# Patient Record
Sex: Male | Born: 1980 | Race: White | Hispanic: No | Marital: Married | Smoking: Never smoker
Health system: Southern US, Community
[De-identification: ages and names within clinical notes are randomized; demographics above are authoritative.]

## PROBLEM LIST (undated history)

## (undated) DIAGNOSIS — T7840XA Allergy, unspecified, initial encounter: Secondary | ICD-10-CM

## (undated) HISTORY — DX: Allergy, unspecified, initial encounter: T78.40XA

---

## 2018-02-01 ENCOUNTER — Other Ambulatory Visit: Payer: Self-pay | Admitting: Chiropractic Medicine

## 2018-02-01 ENCOUNTER — Ambulatory Visit
Admission: RE | Admit: 2018-02-01 | Discharge: 2018-02-01 | Disposition: A | Discharge status: Home / Self Care | Payer: BC Managed Care – PPO | Source: Ambulatory Visit | Attending: Chiropractic Medicine | Admitting: Chiropractic Medicine

## 2018-02-01 DIAGNOSIS — M5136 Other intervertebral disc degeneration, lumbar region: Secondary | ICD-10-CM | POA: Insufficient documentation

## 2018-02-01 DIAGNOSIS — M461 Sacroiliitis, not elsewhere classified: Secondary | ICD-10-CM | POA: Diagnosis present

## 2018-02-01 DIAGNOSIS — M545 Low back pain: Secondary | ICD-10-CM | POA: Diagnosis present

## 2018-02-01 DIAGNOSIS — M48061 Spinal stenosis, lumbar region without neurogenic claudication: Secondary | ICD-10-CM | POA: Insufficient documentation

## 2018-02-01 DIAGNOSIS — M25551 Pain in right hip: Secondary | ICD-10-CM

## 2018-02-28 ENCOUNTER — Ambulatory Visit: Payer: BC Managed Care – PPO | Admitting: Family Medicine

## 2018-02-28 ENCOUNTER — Encounter: Payer: Self-pay | Admitting: Family Medicine

## 2018-02-28 VITALS — BP 112/68 | HR 68 | Temp 97.7°F | Resp 16 | Ht 71.0 in | Wt 296.0 lb

## 2018-02-28 DIAGNOSIS — M7061 Trochanteric bursitis, right hip: Secondary | ICD-10-CM

## 2018-02-28 DIAGNOSIS — J069 Acute upper respiratory infection, unspecified: Secondary | ICD-10-CM

## 2018-02-28 DIAGNOSIS — J301 Allergic rhinitis due to pollen: Secondary | ICD-10-CM

## 2018-02-28 MED ORDER — NAPROXEN 500 MG PO TBEC: 500.0000 mg | DELAYED_RELEASE_TABLET | Freq: Two times a day (BID) | ORAL | 3 refills | Status: DC

## 2018-02-28 MED ORDER — LORATADINE 10 MG PO TABS
10.0000 mg | ORAL_TABLET | Freq: Every day | ORAL | 3 refills | Status: DC
Start: 2018-02-28 — End: 2018-12-12

## 2018-02-28 NOTE — Progress Notes (Signed)
Patient: Marvin James Male    DOB: Jan 25, 1981   36 y.o.   MRN: [id number] Visit Date: 02/28/2018  Today's Provider: [name], MD   Chief Complaint  Patient presents with  . New Patient (Initial Visit)  . Sinus Problem  . Hip Pain   Subjective:    HPI Pt is here today for possible sinus infection. He has sinus congestion, sinus pain, pressure, cough in the mornings, and sneezing. He reports that he has been going on for about 2 weeks. He also has been having problems with his hip and right leg. This has been going on for months. IT is worse when picking up heavy objects, going up steps and walking a lot. He has been to  The chiropractor did x-rays and showed mild degenerative disc space narrowing at L3-L4. No other bony abnormality.     Not on File  No current outpatient medications on file.  Review of Systems  Constitutional: Positive for fatigue.  HENT: Positive for congestion, rhinorrhea, sinus pressure, sneezing and sore throat.   Eyes: Positive for photophobia, redness and itching.  Respiratory: Positive for cough.   Cardiovascular: Negative.   Gastrointestinal: Negative.   Endocrine: Negative.   Genitourinary: Negative.   Musculoskeletal: Positive for back pain.  Skin: Negative.   Allergic/Immunologic: Positive for environmental allergies.  Neurological: Negative.   Hematological: Negative.   Psychiatric/Behavioral: Negative.     Social History   Tobacco Use  . Smoking status: Never Smoker  . Smokeless tobacco: Never Used  Substance Use Topics  . Alcohol use: Yes   Objective:   BP 112/68 (BP Location: Left Arm, Patient Position: Sitting, Cuff Size: Large)   Pulse 68   Temp 97.7 F (36.5 C) (Oral)   Resp 16   Ht  (1.803 m)   Wt 296 lb (134.3 kg)   BMI 41.28 kg/m  Vitals:   02/28/18 1440  BP: 112/68  Pulse: 68  Resp: 16  Temp: 97.7 F (36.5 C)  TempSrc: Oral  Weight: 296 lb (134.3 kg)  Height:  (1.803 m)      Physical Exam  Constitutional: He is oriented to person, place, and time. He appears well-developed and well-nourished.  HENT:  Head: Normocephalic and atraumatic.  Right Ear: External ear normal.  Left Ear: External ear normal.  Nose: Nose normal.  Mouth/Throat: Oropharynx is clear and moist.  Eyes: Conjunctivae are normal. No scleral icterus.  Neck: No thyromegaly present.  Cardiovascular: Normal rate, regular rhythm and normal heart sounds.  Pulmonary/Chest: Effort normal and breath sounds normal.  Abdominal: Soft.  Musculoskeletal: He exhibits no edema.  Lymphadenopathy:    He has no cervical adenopathy.  Neurological: He is alert and oriented to person, place, and time.  Skin: Skin is warm and dry.  Psychiatric: He has a normal mood and affect. His behavior is normal. Judgment and thought content normal.        Assessment & Plan:     1. Encounter to establish care CPE later this year.  2. Trochanteric bursitis of right hip  - naproxen (EC NAPROSYN) 500 MG EC tablet; Take 1 tablet (500 mg total) by mouth 2 (two) times daily with a meal.  Dispense: 60 tablet; Refill: 3  3. Seasonal allergic rhinitis due to pollen  - loratadine (CLARITIN) 10 MG tablet; Take 1 tablet (10 mg total) by mouth daily.  Dispense: 90 tablet; Refill: 3  4. Upper respiratory tract infection, unspecified type Viral.  Mucinex and fluids,rest. 5. Possible OSA  I have done the exam and reviewed the chart and it is accurate to the best of my knowledge. Dentist has been used and  any errors in dictation or transcription are unintentional. Julieanne Manson M.D. [facility]       [name], MD  [facility]

## 2018-02-28 NOTE — Patient Instructions (Signed)
Try Mucinex

## 2018-03-24 ENCOUNTER — Telehealth: Payer: Self-pay | Admitting: Emergency Medicine

## 2018-03-24 DIAGNOSIS — M7061 Trochanteric bursitis, right hip: Secondary | ICD-10-CM

## 2018-03-24 MED ORDER — PREDNISONE 10 MG (21) PO TBPK: ORAL_TABLET | ORAL | 0 refills | Status: DC

## 2018-03-24 NOTE — Telephone Encounter (Signed)
Sent. Pt wife advised.

## 2018-03-24 NOTE — Telephone Encounter (Signed)
Can stop Naproxen and try Prednisone 10mg  6 day taper. Can f/u here in 1 week if not improving or refer to Ortho.

## 2018-03-24 NOTE — Telephone Encounter (Signed)
Pt reports that he has been taking the naproxen as prescribed and his hip/back is not any better. Is there anything else that can be done or what's the next step? Please advise. Thanks. Pt uses [facility] [address].

## 2018-05-03 ENCOUNTER — Encounter: Payer: Self-pay | Admitting: Family Medicine

## 2018-05-04 ENCOUNTER — Ambulatory Visit (INDEPENDENT_AMBULATORY_CARE_PROVIDER_SITE_OTHER): Payer: BC Managed Care – PPO | Admitting: Family Medicine

## 2018-05-04 VITALS — BP 110/70 | HR 70 | Temp 98.3°F | Resp 16 | Wt 291.0 lb

## 2018-05-04 DIAGNOSIS — Z0000 Encounter for general adult medical examination without abnormal findings: Secondary | ICD-10-CM | POA: Diagnosis not present

## 2018-05-04 LAB — POCT URINALYSIS DIPSTICK
APPEARANCE: NEGATIVE
Bilirubin, UA: NEGATIVE
Blood, UA: NEGATIVE
GLUCOSE UA: NEGATIVE
Ketones, UA: NEGATIVE
Leukocytes, UA: NEGATIVE
Nitrite, UA: NEGATIVE
Protein, UA: NEGATIVE
Spec Grav, UA: 1.02 (ref 1.010–1.025)
Urobilinogen, UA: 0.2 E.U./dL
pH, UA: 6 (ref 5.0–8.0)

## 2018-05-04 NOTE — Progress Notes (Signed)
Patient: Marvin ProwsDavid Kellman, Male    DOB: 03/19/1981, 37 y.o.   MRN: [id number] Visit Date: 05/04/2018  Today's Provider: Megan Mansichard Gilbert Jr, MD   Chief Complaint  Patient presents with  . Annual Exam   Subjective:  Marvin James is a 37 y.o. male who presents today for health maintenance and complete physical. He feels well. He reports exercising none. He reports he is sleeping well.   Review of Systems  Constitutional: Negative.   HENT: Positive for ear pain.   Eyes: Positive for photophobia and itching.  Respiratory: Negative.        Pt does snore--no known apnea.  Cardiovascular: Negative.   Gastrointestinal: Negative.   Endocrine: Negative.   Genitourinary: Negative.   Musculoskeletal: Positive for back pain.  Skin: Negative.   Allergic/Immunologic: Negative.   Neurological: Negative.   Hematological: Negative.   Psychiatric/Behavioral: Negative.     Social History   Socioeconomic History  . Marital status: Married    Spouse name: Not on file  . Number of children: Not on file  . Years of education: Not on file  . Highest education level: Not on file  Occupational History  . Not on file  Social Needs  . Financial resource strain: Not on file  . Food insecurity:    Worry: Not on file    Inability: Not on file  . Transportation needs:    Medical: Not on file    Non-medical: Not on file  Tobacco Use  . Smoking status: Never Smoker  . Smokeless tobacco: Never Used  Substance and Sexual Activity  . Alcohol use: Yes  . Drug use: Never  . Sexual activity: Yes  Lifestyle  . Physical activity:    Days per week: Not on file    Minutes per session: Not on file  . Stress: Not on file  Relationships  . Social connections:    Talks on phone: Not on file    Gets together: Not on file    Attends religious service: Not on file    Active member of club or organization: Not on file    Attends meetings of clubs or organizations: Not on file    Relationship status: Not on  file  . Intimate partner violence:    Fear of current or ex partner: Not on file    Emotionally abused: Not on file    Physically abused: Not on file    Forced sexual activity: Not on file  Other Topics Concern  . Not on file  Social History Narrative  . Not on file    There are no active problems to display for this patient.   No past surgical history on file.  His family history includes Arthritis in his mother; Diabetes in his father; Hypertension in his father; Stroke in his father.     Outpatient Encounter Medications as of 05/04/2018  Medication Sig  . loratadine (CLARITIN) 10 MG tablet Take 1 tablet (10 mg total) by mouth daily.  . naproxen (EC NAPROSYN) 500 MG EC tablet Take 1 tablet (500 mg total) by mouth 2 (two) times daily with a meal.  . [DISCONTINUED] predniSONE (STERAPRED UNI-PAK 21 TAB) 10 MG (21) TBPK tablet Take 6 pills on the first day then decrease by one daily until finished.   No facility-administered encounter medications on file as of 05/04/2018.     Patient Care Team: Maple HudsonGilbert, Richard L Jr., MD as PCP - General (Family Medicine)      Objective:  Vitals:  Vitals:   05/04/18 1419  BP: 110/70  Pulse: 70  Resp: 16  Temp: 98.3 F (36.8 C)  TempSrc: Oral  Weight: 291 lb (132 kg)    Physical Exam  Constitutional: He is oriented to person, place, and time. He appears well-developed and well-nourished.  HENT:  Head: Normocephalic and atraumatic.  Right Ear: External ear normal.  Left Ear: External ear normal.  Nose: Nose normal.  Mouth/Throat: Oropharynx is clear and moist.  Eyes: Pupils are equal, round, and reactive to light. Conjunctivae and EOM are normal.  Neck: Normal range of motion. Neck supple.  Cardiovascular: Normal rate, regular rhythm, normal heart sounds and intact distal pulses.  Pulmonary/Chest: Effort normal and breath sounds normal.  Abdominal: Soft.  Genitourinary: Rectum normal, prostate normal and penis normal.   Musculoskeletal: Normal range of motion.  Neurological: He is alert and oriented to person, place, and time.  Skin: Skin is warm and dry.  Psychiatric: He has a normal mood and affect. His behavior is normal. Judgment and thought content normal.     Depression Screen PHQ 2/9 Scores 05/04/2018 02/28/2018  PHQ - 2 Score 2 0  PHQ- 9 Score 8 1      Assessment & Plan:     Routine Health Maintenance and Physical Exam  Exercise Activities and Dietary recommendations Goals    None       There is no immunization history on file for this patient.  Health Maintenance  Topic Date Due  . HIV Screening  04/18/1995  . TETANUS/TDAP  04/18/1999  . INFLUENZA VACCINE  05/12/2018     Discussed health benefits of physical activity, and encouraged him to engage in regular exercise appropriate for his age and condition. Possible mild OSA Pt to work on weight loss. Chronic Low Back Pain/strain Work on [employer].  I have done the exam and reviewed the chart and it is accurate to the best of my knowledge. Dentist has been used and  any errors in dictation or transcription are unintentional. Julieanne Manson M.D. [facility]

## 2018-05-04 NOTE — Patient Instructions (Signed)
Patient advised to try Yoga or Pilates for exercise

## 2018-05-10 ENCOUNTER — Telehealth: Payer: Self-pay

## 2018-05-10 LAB — COMPREHENSIVE METABOLIC PANEL
ALK PHOS: 52 IU/L (ref 39–117)
ALT: 30 IU/L (ref 0–44)
AST: 18 IU/L (ref 0–40)
Albumin/Globulin Ratio: 1.3 (ref 1.2–2.2)
Albumin: 4 g/dL (ref 3.5–5.5)
BUN/Creatinine Ratio: 14 (ref 9–20)
BUN: 13 mg/dL (ref 6–20)
Bilirubin Total: 1 mg/dL (ref 0.0–1.2)
CO2: 24 mmol/L (ref 20–29)
CREATININE: 0.96 mg/dL (ref 0.76–1.27)
Calcium: 8.7 mg/dL (ref 8.7–10.2)
Chloride: 102 mmol/L (ref 96–106)
GFR calc Af Amer: 115 mL/min/{1.73_m2} (ref >59)
GFR calc non Af Amer: 100 mL/min/{1.73_m2} (ref >59)
GLUCOSE: 83 mg/dL (ref 65–99)
Globulin, Total: 3 g/dL (ref 1.5–4.5)
Potassium: 3.6 mmol/L (ref 3.5–5.2)
SODIUM: 139 mmol/L (ref 134–144)
Total Protein: 7 g/dL (ref 6.0–8.5)

## 2018-05-10 LAB — CBC WITH DIFFERENTIAL/PLATELET
BASOS ABS: 0 10*3/uL (ref 0.0–0.2)
Basos: 0 %
EOS (ABSOLUTE): 0.2 10*3/uL (ref 0.0–0.4)
Eos: 2 %
Hematocrit: 44 % (ref 37.5–51.0)
Hemoglobin: 15.4 g/dL (ref 13.0–17.7)
Immature Grans (Abs): 0 10*3/uL (ref 0.0–0.1)
Immature Granulocytes: 0 %
LYMPHS ABS: 3.7 10*3/uL — AB (ref 0.7–3.1)
Lymphs: 42 %
MCH: 33.6 pg — ABNORMAL HIGH (ref 26.6–33.0)
MCHC: 35 g/dL (ref 31.5–35.7)
MCV: 96 fL (ref 79–97)
MONOCYTES: 8 %
MONOS ABS: 0.7 10*3/uL (ref 0.1–0.9)
Neutrophils Absolute: 4.2 10*3/uL (ref 1.4–7.0)
Neutrophils: 48 %
Platelets: 243 10*3/uL (ref 150–450)
RBC: 4.59 x10E6/uL (ref 4.14–5.80)
RDW: 13.3 % (ref 12.3–15.4)
WBC: 8.8 10*3/uL (ref 3.4–10.8)

## 2018-05-10 LAB — LIPID PANEL WITH LDL/HDL RATIO
Cholesterol, Total: 152 mg/dL (ref 100–199)
HDL: 51 mg/dL (ref >39)
LDL Calculated: 80 mg/dL (ref 0–99)
LDl/HDL Ratio: 1.6 ratio (ref 0.0–3.6)
Triglycerides: 107 mg/dL (ref 0–149)
VLDL Cholesterol Cal: 21 mg/dL (ref 5–40)

## 2018-05-10 LAB — TSH: TSH: 3.81 u[IU]/mL (ref 0.450–4.500)

## 2018-05-10 NOTE — Telephone Encounter (Signed)
LMTCB   ----- Message from Maple Hudsonichard L Gilbert Jr., MD sent at 05/10/2018 12:35 PM EDT ----- Labs OK

## 2018-05-10 NOTE — Telephone Encounter (Signed)
-----   Message from Maple Hudsonichard L Gilbert Jr., MD sent at 05/10/2018 12:35 PM EDT ----- Labs OK

## 2018-09-27 ENCOUNTER — Encounter: Payer: Self-pay | Admitting: Family Medicine

## 2018-09-27 ENCOUNTER — Ambulatory Visit: Payer: BC Managed Care – PPO | Admitting: Family Medicine

## 2018-09-27 VITALS — BP 118/72 | HR 72 | Temp 98.0°F | Resp 16 | Ht 71.0 in | Wt 291.0 lb

## 2018-09-27 DIAGNOSIS — G4733 Obstructive sleep apnea (adult) (pediatric): Secondary | ICD-10-CM

## 2018-09-27 DIAGNOSIS — H6690 Otitis media, unspecified, unspecified ear: Secondary | ICD-10-CM

## 2018-09-27 MED ORDER — AZITHROMYCIN 250 MG PO TABS: ORAL_TABLET | ORAL | 0 refills | Status: DC

## 2018-09-27 NOTE — Progress Notes (Signed)
Patient: Marvin ProwsDavid Mortell Male    DOB: 11/18/1980   37 y.o.   MRN: [id number] Visit Date: 09/27/2018  Today's Provider: Megan Mansichard Quillan Whitter Jr, MD   Chief Complaint  Patient presents with  . Sleep Apnea  . URI   Subjective:     HPI Patient comes in today wanting to discuss getting a sleep study. He feels that he would benefit from a CPAP machine. He does snore at night. Epworth back in May 2019 was 11.   He also c/o sinus infection X 2 weeks. He initially took Advil cold and sinus which did not help. About 3 days ago, he has used Sudafed and it seems to help more. Patient reports that he has cough, PND, and headaches. Denies fevers.   No Known Allergies   Current Outpatient Medications:  .  loratadine (CLARITIN) 10 MG tablet, Take 1 tablet (10 mg total) by mouth daily., Disp: 90 tablet, Rfl: 3 .  naproxen (EC NAPROSYN) 500 MG EC tablet, Take 1 tablet (500 mg total) by mouth 2 (two) times daily with a meal., Disp: 60 tablet, Rfl: 3  Review of Systems  Constitutional: Positive for activity change and fatigue. Negative for appetite change, chills, diaphoresis and fever.  HENT: Positive for congestion, postnasal drip, sinus pressure and sinus pain.   Respiratory: Positive for cough.   Cardiovascular: Negative for chest pain, palpitations and leg swelling.  Allergic/Immunologic: Positive for environmental allergies.  Neurological: Positive for headaches. Negative for light-headedness.  Psychiatric/Behavioral: Negative.     Social History   Tobacco Use  . Smoking status: Never Smoker  . Smokeless tobacco: Never Used  Substance Use Topics  . Alcohol use: Yes      Objective:   BP 118/72 (BP Location: Left Arm, Patient Position: Sitting, Cuff Size: Large)   Pulse 72   Temp 98 F (36.7 C)   Resp 16   Ht 5\' 11"  (1.803 m)   Wt 291 lb (132 kg)   SpO2 96%   BMI 40.59 kg/m  Vitals:   09/27/18 1512  BP: 118/72  Pulse: 72  Resp: 16  Temp: 98 F (36.7 C)  SpO2: 96%    Weight: 291 lb (132 kg)  Height: 5\' 11"  (1.803 m)     Physical Exam Constitutional:      Appearance: He is well-developed.  HENT:     Head: Normocephalic and atraumatic.     Right Ear: Tympanic membrane and external ear normal.     Left Ear: External ear normal.     Ears:     Comments: Left TM bulging,red.    Nose: Nose normal.  Eyes:     Conjunctiva/sclera: Conjunctivae normal.     Pupils: Pupils are equal, round, and reactive to light.  Neck:     Musculoskeletal: Normal range of motion and neck supple.  Cardiovascular:     Rate and Rhythm: Normal rate and regular rhythm.     Heart sounds: Normal heart sounds.  Pulmonary:     Effort: Pulmonary effort is normal.     Breath sounds: Normal breath sounds.  Abdominal:     Palpations: Abdomen is soft.  Genitourinary:    Rectum: Normal.  Musculoskeletal: Normal range of motion.  Skin:    General: Skin is warm and dry.  Neurological:     Mental Status: He is alert and oriented to person, place, and time.  Psychiatric:        Behavior: Behavior normal.  Thought Content: Thought content normal.        Judgment: Judgment normal.         Assessment & Plan    1. Acute otitis media, unspecified otitis media type  - azithromycin (ZITHROMAX) 250 MG tablet; Take 2 tablets by mouth today, then 1 tablet by mouth daily thereafter.  Dispense: 6 tablet; Refill: 0  2. OSA (obstructive sleep apnea) Patient needs a sleep study and will most likely benefit from CPAP.  If possible we will order CPAP auto titrating machine when the time comes.  Lifestyle and weight loss discussed. - Home sleep test    I have done the exam and reviewed the above chart and it is accurate to the best of my knowledge. Dentist has been used in this note in any air is in the dictation or transcription are unintentional.  [name], MD  [facility]

## 2018-10-11 ENCOUNTER — Telehealth: Payer: Self-pay | Admitting: Family Medicine

## 2018-10-11 NOTE — Telephone Encounter (Signed)
Order for home sleep study faxed to [facility] °

## 2018-12-01 ENCOUNTER — Ambulatory Visit: Payer: Self-pay | Admitting: Family Medicine

## 2018-12-12 ENCOUNTER — Ambulatory Visit: Payer: BC Managed Care – PPO | Admitting: Family Medicine

## 2018-12-12 ENCOUNTER — Encounter: Payer: Self-pay | Admitting: Family Medicine

## 2018-12-12 ENCOUNTER — Other Ambulatory Visit: Payer: Self-pay

## 2018-12-12 VITALS — BP 126/80 | HR 81 | Temp 98.1°F | Ht 71.0 in | Wt 298.4 lb

## 2018-12-12 DIAGNOSIS — G4733 Obstructive sleep apnea (adult) (pediatric): Secondary | ICD-10-CM

## 2018-12-12 DIAGNOSIS — J301 Allergic rhinitis due to pollen: Secondary | ICD-10-CM | POA: Diagnosis not present

## 2018-12-12 DIAGNOSIS — J329 Chronic sinusitis, unspecified: Secondary | ICD-10-CM | POA: Diagnosis not present

## 2018-12-12 MED ORDER — PHENTERMINE HCL 30 MG PO CAPS: 30.0000 mg | ORAL_CAPSULE | ORAL | 4 refills | Status: DC

## 2018-12-12 MED ORDER — LORATADINE 10 MG PO TABS: 10.0000 mg | ORAL_TABLET | Freq: Every day | ORAL | 3 refills | Status: DC

## 2018-12-12 MED ORDER — AMOXICILLIN-POT CLAVULANATE 875-125 MG PO TABS: 1.0000 | ORAL_TABLET | Freq: Two times a day (BID) | ORAL | 0 refills | Status: DC

## 2018-12-12 NOTE — Progress Notes (Signed)
Patient: Marvin James Male    DOB: 02/12/1981   38 y.o.   MRN: [id number] Visit Date: 12/12/2018  Today's Provider: [name], MD   Chief Complaint  Patient presents with  . Follow-up    2 month fup   Subjective:     HPI  Pt reports he is being seen for a 2 month fup from a home sleep study.  He has mild sleep apnea and therefore insurance would not cover a CPAP.  He inquires about diet pills to help him lose weight.  Wife has had success with this.  Pt also reports he has had a cold for 2 weeks now and has used sudafed and has helped some.  Still has a cough and congestion with green mucus.  No fever but he does have dental pain in his upper teeth. ------------------------------------------------------------------------------------   No Known Allergies   Current Outpatient Medications:  .  loratadine (CLARITIN) 10 MG tablet, Take 1 tablet (10 mg total) by mouth daily., Disp: 90 tablet, Rfl: 3 .  naproxen (EC NAPROSYN) 500 MG EC tablet, Take 1 tablet (500 mg total) by mouth 2 (two) times daily with a meal., Disp: 60 tablet, Rfl: 3 .  amoxicillin-clavulanate (AUGMENTIN) 875-125 MG tablet, Take 1 tablet by mouth 2 (two) times daily., Disp: 20 tablet, Rfl: 0 .  azithromycin (ZITHROMAX) 250 MG tablet, Take 2 tablets by mouth today, then 1 tablet by mouth daily thereafter. (Patient not taking: Reported on 12/12/2018), Disp: 6 tablet, Rfl: 0 .  phentermine 30 MG capsule, Take 1 capsule (30 mg total) by mouth every morning., Disp: 30 capsule, Rfl: 4  Review of Systems  Constitutional: Negative.   HENT: Positive for congestion (with green mucus).   Eyes: Negative.   Respiratory: Positive for cough.   Cardiovascular: Negative.   Gastrointestinal: Negative.   Endocrine: Negative.   Genitourinary: Negative.   Musculoskeletal: Negative.   Skin: Negative.   Allergic/Immunologic: Negative.   Neurological: Negative.   Hematological: Negative.   Psychiatric/Behavioral:  Negative.     Social History   Tobacco Use  . Smoking status: Never Smoker  . Smokeless tobacco: Never Used  Substance Use Topics  . Alcohol use: Yes      Objective:   BP 126/80 (BP Location: Right Arm, Patient Position: Sitting, Cuff Size: Large)   Pulse 81   Temp 98.1 F (36.7 C) (Oral)   Ht 5\' 11"  (1.803 m)   Wt 298 lb 6.4 oz (135.4 kg)   SpO2 97%   BMI 41.62 kg/m  Vitals:   12/12/18 1602  BP: 126/80  Pulse: 81  Temp: 98.1 F (36.7 C)  TempSrc: Oral  SpO2: 97%  Weight: 298 lb 6.4 oz (135.4 kg)  Height: 5\' 11"  (1.803 m)     Physical Exam Constitutional:      Appearance: He is well-developed. He is obese.  HENT:     Head: Normocephalic and atraumatic.     Comments: maxillary sinuses mildly tender.    Right Ear: Tympanic membrane and external ear normal.     Left Ear: Tympanic membrane and external ear normal.     Nose: Nose normal.     Mouth/Throat:     Mouth: Mucous membranes are moist.     Pharynx: Oropharynx is clear.  Eyes:     Conjunctiva/sclera: Conjunctivae normal.     Pupils: Pupils are equal, round, and reactive to light.  Neck:     Musculoskeletal: Normal range of  motion and neck supple.  Cardiovascular:     Rate and Rhythm: Normal rate and regular rhythm.     Heart sounds: Normal heart sounds.  Pulmonary:     Effort: Pulmonary effort is normal.     Breath sounds: Normal breath sounds.  Abdominal:     Palpations: Abdomen is soft.  Genitourinary:    Penis: Normal.      Prostate: Normal.     Rectum: Normal.  Musculoskeletal: Normal range of motion.  Skin:    General: Skin is warm and dry.  Neurological:     General: No focal deficit present.     Mental Status: He is alert and oriented to person, place, and time. Mental status is at baseline.  Psychiatric:        Mood and Affect: Mood normal.        Behavior: Behavior normal.        Thought Content: Thought content normal.        Judgment: Judgment normal.         Assessment &  Plan    1. Chronic sinusitis, unspecified location Treat for 10 days. - amoxicillin-clavulanate (AUGMENTIN) 875-125 MG tablet; Take 1 tablet by mouth 2 (two) times daily.  Dispense: 20 tablet; Refill: 0  2. Morbid obesity (HCC) Try phentermine for 2 to 3 months.  Reassessment within 3 months. - phentermine 30 MG capsule; Take 1 capsule (30 mg total) by mouth every morning.  Dispense: 30 capsule; Refill: 4  3. Seasonal allergic rhinitis due to pollen  - loratadine (CLARITIN) 10 MG tablet; Take 1 tablet (10 mg total) by mouth daily.  Dispense: 90 tablet; Refill: 3  4. OSA (obstructive sleep apnea) Patient has mild sleep apnea.  If he loses weight I think this will address the issue.  Also try to keep him off of his back. Reassess in 3 months.   I have done the exam and reviewed the above chart and it is accurate to the best of my knowledge. Dentist has been used in this note in any air is in the dictation or transcription are unintentional.  [name], MD  [facility]

## 2019-01-31 ENCOUNTER — Telehealth: Payer: Self-pay

## 2019-01-31 NOTE — Telephone Encounter (Signed)
-----   Message from Maple Hudson., MD sent at 01/26/2019  2:37 PM EDT ----- Patient does have mild sleep apnea but I think it is mild enough that he if we work hard on diet and exercise with resulting weight loss that will improve.  Another thing to do in the interval is to put a tennis ball in a sock and then pinned the sock to his back at night so when he rolls over the ball will keep him from rolling and staying on his back.  Let me see him back in 3 months to see how he is doing with his weight.  We will be happy to try to prescribe phentermine short-term to help him start this process.

## 2019-01-31 NOTE — Telephone Encounter (Signed)
Left message to call back  

## 2019-02-01 NOTE — Telephone Encounter (Signed)
Spoke to pt and advised results and pt has an appt in July for a CPE

## 2019-04-19 ENCOUNTER — Ambulatory Visit: Payer: Self-pay | Admitting: Family Medicine

## 2019-05-03 ENCOUNTER — Encounter: Payer: Self-pay | Admitting: Family Medicine

## 2019-05-09 ENCOUNTER — Encounter: Payer: Self-pay | Admitting: Family Medicine

## 2020-04-07 IMAGING — CR DG LUMBAR SPINE 2-3V
1 series · 3 of 3 positions shown · non-contrast
Comparison: None in PACs

CLINICAL DATA: Low back and right hip pain with spasmodic episodes
for the past year. No known injury.

EXAM:
LUMBAR SPINE - 2-3 VIEW

[Series 1: dg lumbar spine 2-3 views · 0.14mm/px · 3 of 3 slices shown]
[im 1/3]
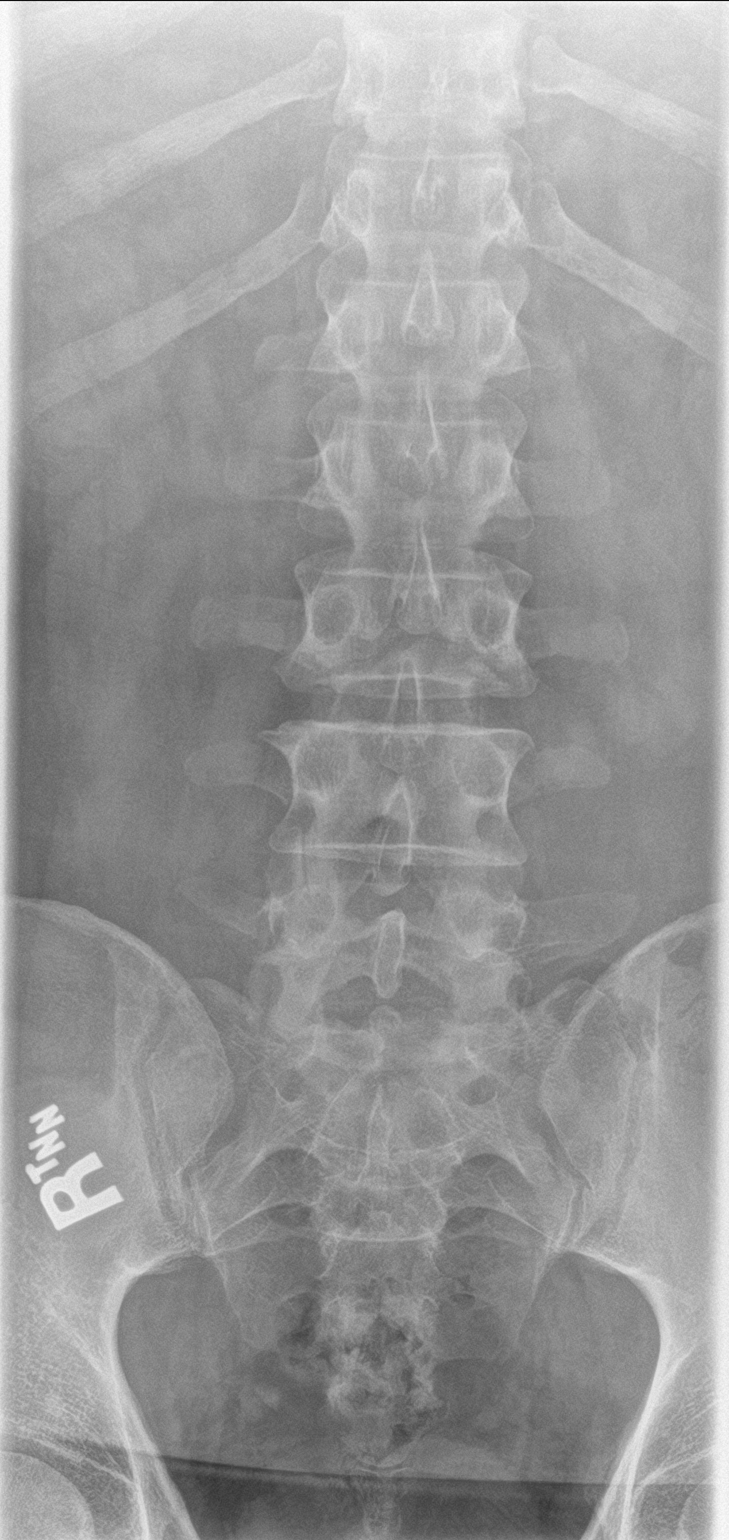
[im 2/3]
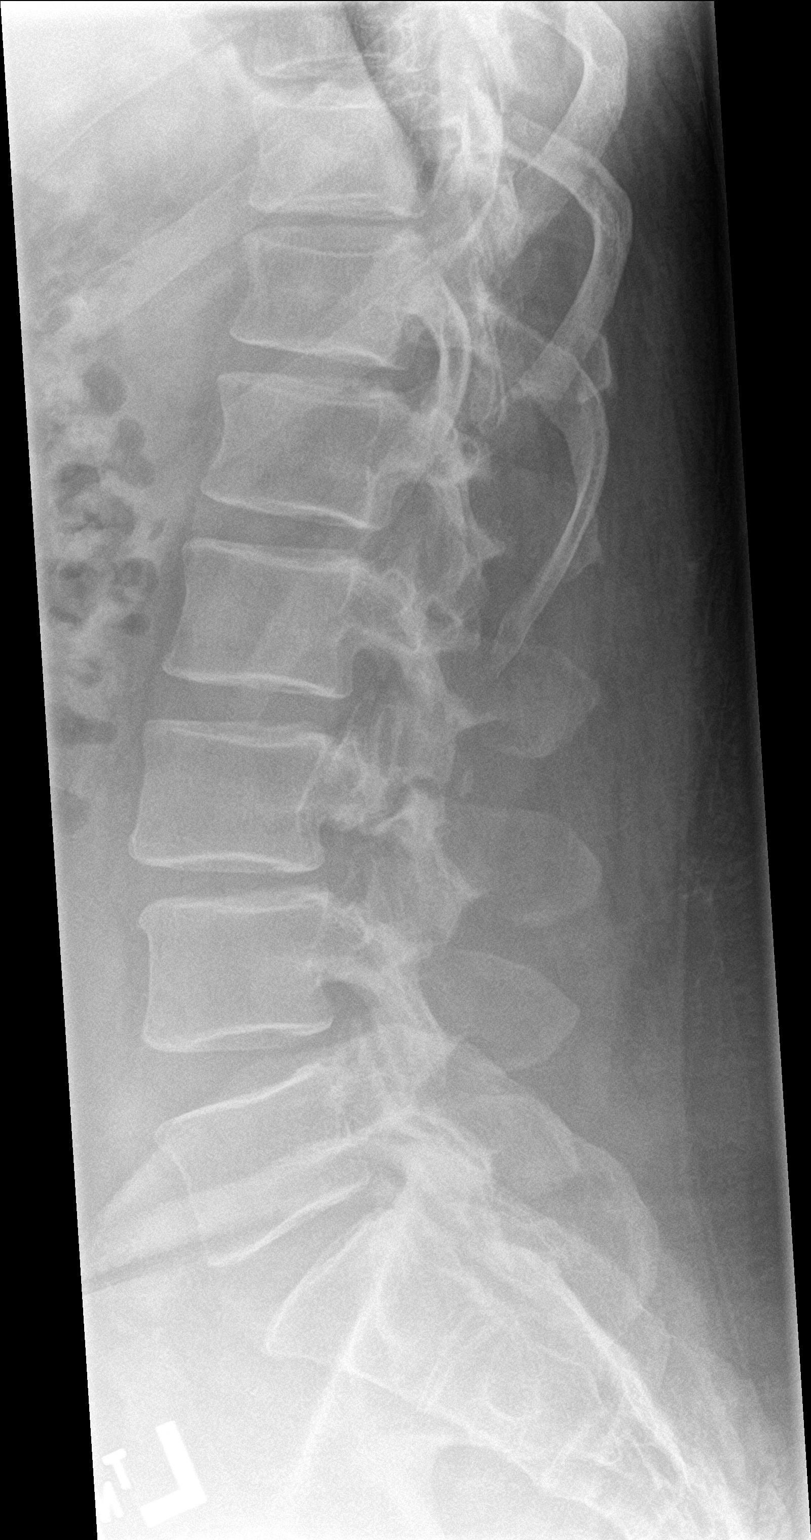
[im 3/3]
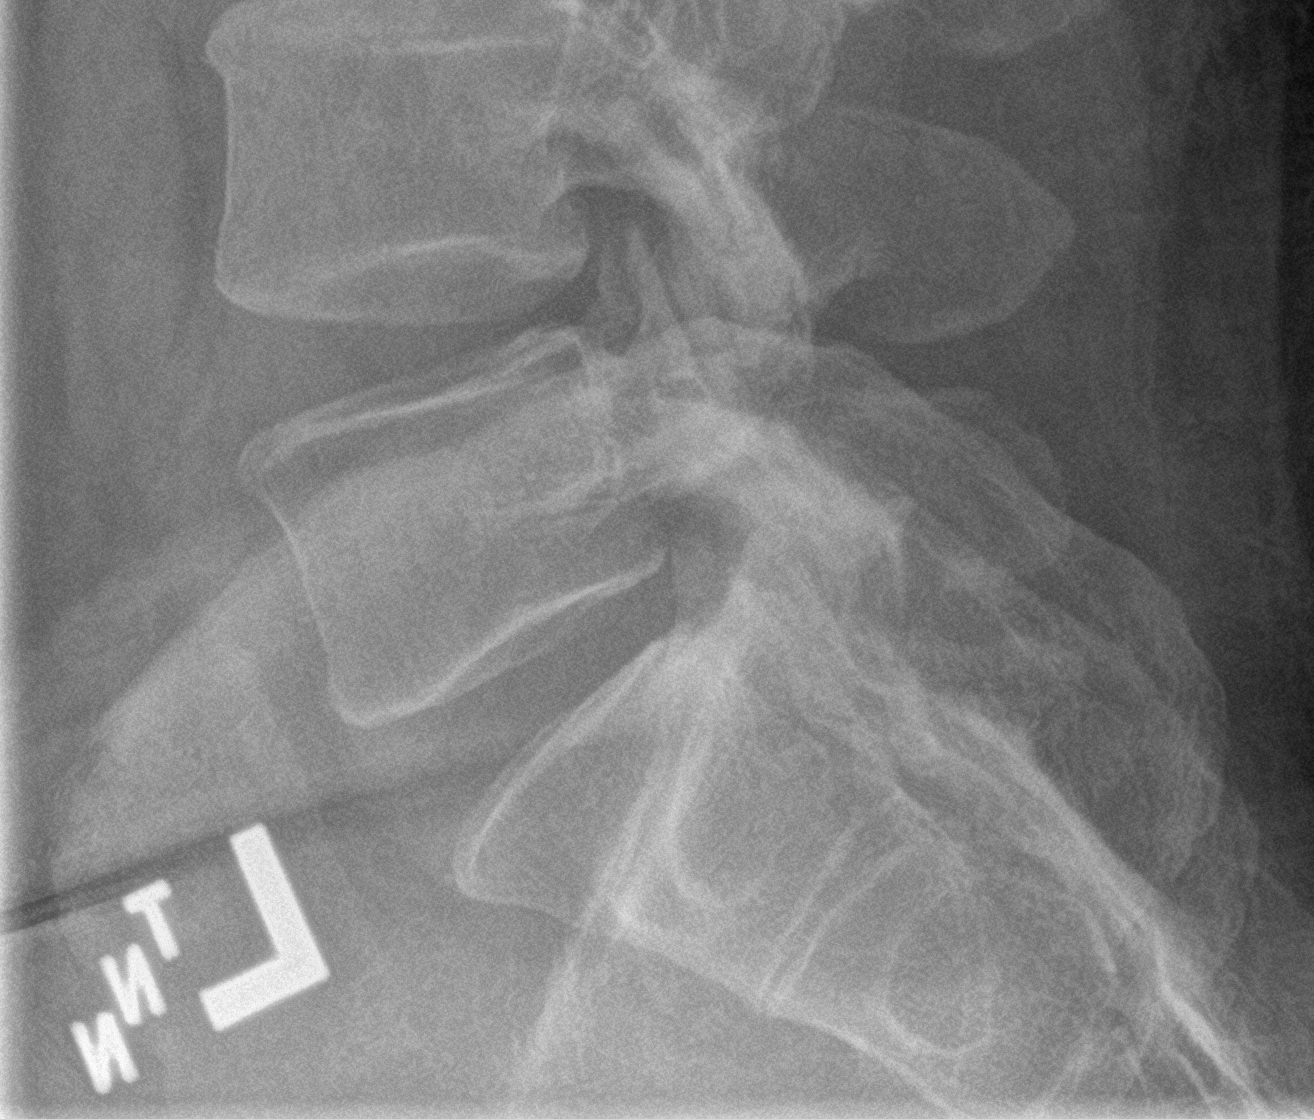

[3 of 3 positions shown; findings below may reference images not displayed]

FINDINGS: The lumbar vertebral bodies are preserved in height. The disc space
heights are well maintained with exception of L3-4 where there is
mild narrowing. There is no spondylolisthesis. The pedicles and
transverse processes are intact..
IMPRESSION: Mild degenerative disc space narrowing at L3-4. No acute bony
abnormality.

## 2021-01-17 NOTE — Progress Notes (Signed)
Complete physical exam   Patient: Marvin James   DOB: 05/18/1981   39 y.o. Male  MRN: [id number] Visit Date: 01/20/2021  Today's healthcare provider: [name], MD   Chief Complaint  Patient presents with  . Annual Exam   Subjective    Marvin James is a 40 y.o. male who presents today for a complete physical exam.  He reports consuming a general diet. The patient does not participate in regular exercise at present. He generally feels well. He reports sleeping well. He does not have additional problems to discuss today.  He is married and is now the father of 2.  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  He would like to get a note to have extended test time for some classes he has to take.  He has never been diagnosed with any learning disability. He would like to try phentermine to help with weight loss again.  Does not get any regular exercise. Past Medical History:  Diagnosis Date  . Allergy    No past surgical history on file. Social History   Socioeconomic History  . Marital status: Married    Spouse name: Not on file  . Number of children: Not on file  . Years of education: Not on file  . Highest education level: Not on file  Occupational History  . Not on file  Tobacco Use  . Smoking status: Never Smoker  . Smokeless tobacco: Never Used  Vaping Use  . Vaping Use: Never used  Substance and Sexual Activity  . Alcohol use: Yes  . Drug use: Never  . Sexual activity: Yes  Other Topics Concern  . Not on file  Social History Narrative  . Not on file   Social Determinants of Health   Financial Resource Strain: Not on file  Food Insecurity: Not on file  Transportation Needs: Not on file  Physical Activity: Not on file  Stress: Not on file  Social Connections: Not on file  Intimate Partner Violence: Not on file   Family Status  Relation Name Status  . Mother  Alive  . Father  Deceased  . Sister  Alive    Family History  Problem Relation Age of Onset  . Arthritis Mother   . Diabetes Father   . Stroke Father   . Hypertension Father    No Known Allergies  Patient Care Team: Maple Hudson., MD as PCP - General (Family Medicine)   Medications: Outpatient Medications Prior to Visit  Medication Sig  . loratadine (CLARITIN) 10 MG tablet Take 1 tablet (10 mg total) by mouth daily.  . naproxen (EC NAPROSYN) 500 MG EC tablet Take 1 tablet (500 mg total) by mouth 2 (two) times daily with a meal.  . amoxicillin-clavulanate (AUGMENTIN) 875-125 MG tablet Take 1 tablet by mouth 2 (two) times daily. (Patient not taking: Reported on 01/20/2021)  . azithromycin (ZITHROMAX) 250 MG tablet Take 2 tablets by mouth today, then 1 tablet by mouth daily thereafter.  . phentermine 30 MG capsule Take 1 capsule (30 mg total) by mouth every morning. (Patient not taking: Reported on 01/20/2021)   No facility-administered medications prior to visit.    Review of Systems  All other systems reviewed and are negative.      Objective    BP 131/86   Pulse 71   Resp 16   Ht 5\' 8"  (1.727 m)   Wt (!) 307 lb (139.3  kg)   BMI 46.68 kg/m  BP Readings from Last 3 Encounters:  01/20/21 131/86  12/12/18 126/80  09/27/18 118/72   Wt Readings from Last 3 Encounters:  01/20/21 (!) 307 lb (139.3 kg)  12/12/18 298 lb 6.4 oz (135.4 kg)  09/27/18 291 lb (132 kg)      Physical Exam Vitals reviewed.  Constitutional:      Appearance: He is well-developed.  HENT:     Head: Normocephalic and atraumatic.     Right Ear: External ear normal.     Left Ear: External ear normal.     Nose: Nose normal.  Eyes:     Conjunctiva/sclera: Conjunctivae normal.     Pupils: Pupils are equal, round, and reactive to light.  Cardiovascular:     Rate and Rhythm: Normal rate and regular rhythm.     Heart sounds: Normal heart sounds.  Pulmonary:     Effort: Pulmonary effort is normal.     Breath sounds: Normal breath  sounds.  Abdominal:     Palpations: Abdomen is soft.  Genitourinary:    Penis: Normal.      Testes: Normal.  Musculoskeletal:     Cervical back: Normal range of motion and neck supple.     Right lower leg: No edema.     Left lower leg: No edema.  Skin:    General: Skin is warm and dry.  Neurological:     General: No focal deficit present.     Mental Status: He is alert and oriented to person, place, and time.  Psychiatric:        Mood and Affect: Mood normal.        Behavior: Behavior normal.        Thought Content: Thought content normal.        Judgment: Judgment normal.        Last depression screening scores PHQ 2/9 Scores 12/12/2018 05/04/2018 02/28/2018  PHQ - 2 Score 0 2 0  PHQ- 9 Score - 8 1   Last fall risk screening Fall Risk  12/12/2018  Falls in the past year? 0   Last Audit-C alcohol use screening Alcohol Use Disorder Test (AUDIT) 05/04/2018  1. How often do you have a drink containing alcohol? 2  2. How many drinks containing alcohol do you have on a typical day when you are drinking? 2  3. How often do you have six or more drinks on one occasion? 1  AUDIT-C Score 5   A score of 3 or more in women, and 4 or more in men indicates increased risk for alcohol abuse, EXCEPT if all of the points are from question 1   No results found for any visits on 01/20/21.  Assessment & Plan    Routine Health Maintenance and Physical Exam  Exercise Activities and Dietary recommendations Goals   None      There is no immunization history on file for this patient.  Health Maintenance  Topic Date Due  . Hepatitis C Screening  Never done  . COVID-19 Vaccine (1) Never done  . HIV Screening  Never done  . TETANUS/TDAP  Never done  . INFLUENZA VACCINE  05/12/2021  . HPV VACCINES  Aged Out    Discussed health benefits of physical activity, and encouraged him to engage in regular exercise appropriate for his age and condition. 1. Annual physical exam  - CBC with  Differential/Platelet - Comprehensive metabolic panel - Lipid panel - TSH  2. Need for hepatitis  C screening test  - Hepatitis C antibody  3. Screening for blood or protein in urine  - POCT urinalysis dipstick  4. Morbid obesity (HCC) Diet and exercise stressed.  To follow-up in 2 months to reweigh in. - phentermine 30 MG capsule; Take 1 capsule (30 mg total) by mouth every morning.  Dispense: 30 capsule; Refill: 4  5. Borderline learning disability We will try to arrange testing through appropriate evaluation such as [facility] or other duties that do testing for learning disabilities.  He was never identified through school was having problems and finish school without any difficulty 6.  Allergic rhinitis Refill Zyrtec   No follow-ups on file.     I, [name], MD, have reviewed all documentation for this visit. The documentation on 01/20/21 for the exam, diagnosis, procedures, and orders are all accurate and complete.    Donielle Radziewicz Wendelyn Breslow, MD  [facility] [phone] (phone) [phone] (fax)  [facility]

## 2021-01-20 ENCOUNTER — Ambulatory Visit (INDEPENDENT_AMBULATORY_CARE_PROVIDER_SITE_OTHER): Payer: Managed Care, Other (non HMO) | Admitting: Family Medicine

## 2021-01-20 ENCOUNTER — Encounter: Payer: Self-pay | Admitting: Family Medicine

## 2021-01-20 ENCOUNTER — Other Ambulatory Visit: Payer: Self-pay

## 2021-01-20 VITALS — BP 131/86 | HR 71 | Resp 16 | Ht 68.0 in | Wt 307.0 lb

## 2021-01-20 DIAGNOSIS — Z1389 Encounter for screening for other disorder: Secondary | ICD-10-CM | POA: Diagnosis not present

## 2021-01-20 DIAGNOSIS — Z1159 Encounter for screening for other viral diseases: Secondary | ICD-10-CM | POA: Diagnosis not present

## 2021-01-20 DIAGNOSIS — Z0000 Encounter for general adult medical examination without abnormal findings: Secondary | ICD-10-CM

## 2021-01-20 DIAGNOSIS — R4183 Borderline intellectual functioning: Secondary | ICD-10-CM

## 2021-01-20 LAB — POCT URINALYSIS DIPSTICK
Bilirubin, UA: NEGATIVE
Blood, UA: NEGATIVE
Glucose, UA: NEGATIVE
Ketones, UA: NEGATIVE
Leukocytes, UA: NEGATIVE
Nitrite, UA: NEGATIVE
Protein, UA: NEGATIVE
Spec Grav, UA: 1.025 (ref 1.010–1.025)
Urobilinogen, UA: 0.2 E.U./dL
pH, UA: 6.5 (ref 5.0–8.0)

## 2021-01-20 MED ORDER — PHENTERMINE HCL 30 MG PO CAPS: 30.0000 mg | ORAL_CAPSULE | ORAL | 4 refills | Status: DC

## 2021-01-21 ENCOUNTER — Telehealth: Payer: Self-pay

## 2021-01-21 LAB — LIPID PANEL
Chol/HDL Ratio: 3.7 ratio (ref 0.0–5.0)
Cholesterol, Total: 160 mg/dL (ref 100–199)
HDL: 43 mg/dL (ref >39)
LDL Chol Calc (NIH): 102 mg/dL — ABNORMAL HIGH (ref 0–99)
Triglycerides: 79 mg/dL (ref 0–149)
VLDL Cholesterol Cal: 15 mg/dL (ref 5–40)

## 2021-01-21 LAB — COMPREHENSIVE METABOLIC PANEL
ALT: 32 IU/L (ref 0–44)
AST: 26 IU/L (ref 0–40)
Albumin/Globulin Ratio: 1.3 (ref 1.2–2.2)
Albumin: 4.4 g/dL (ref 4.0–5.0)
Alkaline Phosphatase: 57 IU/L (ref 44–121)
BUN/Creatinine Ratio: 11 (ref 9–20)
BUN: 11 mg/dL (ref 6–24)
Bilirubin Total: 0.7 mg/dL (ref 0.0–1.2)
CO2: 21 mmol/L (ref 20–29)
Calcium: 9.5 mg/dL (ref 8.7–10.2)
Chloride: 98 mmol/L (ref 96–106)
Creatinine, Ser: 0.97 mg/dL (ref 0.76–1.27)
Globulin, Total: 3.3 g/dL (ref 1.5–4.5)
Glucose: 101 mg/dL — ABNORMAL HIGH (ref 65–99)
Potassium: 4.5 mmol/L (ref 3.5–5.2)
Sodium: 137 mmol/L (ref 134–144)
Total Protein: 7.7 g/dL (ref 6.0–8.5)
eGFR: 101 mL/min/{1.73_m2} (ref >59)

## 2021-01-21 LAB — CBC WITH DIFFERENTIAL/PLATELET
Basophils Absolute: 0.1 10*3/uL (ref 0.0–0.2)
Basos: 1 %
EOS (ABSOLUTE): 0.2 10*3/uL (ref 0.0–0.4)
Eos: 3 %
Hematocrit: 47.9 % (ref 37.5–51.0)
Hemoglobin: 16.5 g/dL (ref 13.0–17.7)
Immature Grans (Abs): 0 10*3/uL (ref 0.0–0.1)
Immature Granulocytes: 0 %
Lymphocytes Absolute: 2.9 10*3/uL (ref 0.7–3.1)
Lymphs: 35 %
MCH: 32.2 pg (ref 26.6–33.0)
MCHC: 34.4 g/dL (ref 31.5–35.7)
MCV: 94 fL (ref 79–97)
Monocytes Absolute: 0.7 10*3/uL (ref 0.1–0.9)
Monocytes: 9 %
Neutrophils Absolute: 4.2 10*3/uL (ref 1.4–7.0)
Neutrophils: 52 %
Platelets: 257 10*3/uL (ref 150–450)
RBC: 5.12 x10E6/uL (ref 4.14–5.80)
RDW: 12.5 % (ref 11.6–15.4)
WBC: 8.1 10*3/uL (ref 3.4–10.8)

## 2021-01-21 LAB — TSH: TSH: 1.29 u[IU]/mL (ref 0.450–4.500)

## 2021-01-21 LAB — HEPATITIS C ANTIBODY: Hep C Virus Ab: <0.1 s/co ratio (ref 0.0–0.9)

## 2021-01-21 NOTE — Telephone Encounter (Signed)
Patient was advised and verbalized understanding. 

## 2021-01-21 NOTE — Telephone Encounter (Signed)
-----   Message from Maple Hudson., MD sent at 01/21/2021  8:14 AM EDT ----- Labs in normal range.  Work on diet and exercise as discussed.

## 2021-02-11 ENCOUNTER — Telehealth: Payer: Self-pay

## 2021-02-11 NOTE — Telephone Encounter (Signed)
Copied from CRM [id number]. Topic: General - Inquiry >> Feb 11, 2021  1:36 PM Daphine Deutscher D wrote: Reason for CRM: Pt called saying when he seen Dr. Sullivan Lone for his physical he had talked to him about seeing a specialist in [facility] for anxiety and he has not heard anything back.  CB#  [phone]

## 2021-02-11 NOTE — Telephone Encounter (Signed)
Dr. Sullivan Lone, who did you want to refer the patient to?

## 2021-02-12 NOTE — Telephone Encounter (Signed)
Left message for [facility] to give [place] a call back in regards to scheduling an appt for the patient.

## 2021-02-12 NOTE — Telephone Encounter (Signed)
[employer] at [phone]

## 2021-02-18 NOTE — Telephone Encounter (Signed)
Patient called needing a letter saying that he needs more time taking a test at work. He says that they only give him 2 mins per question, and he gets anxious when he runs out of time. His employer will allow this if he has a doctors note. Ok to write? Please advise. Thanks!

## 2021-02-18 NOTE — Telephone Encounter (Signed)
Ok. Can we write the letter for him to have permission to have extended time while taking his test?

## 2021-02-18 NOTE — Telephone Encounter (Signed)
ok 

## 2021-02-18 NOTE — Telephone Encounter (Signed)
Have the patient her number him to call for an appointment.

## 2021-02-18 NOTE — Telephone Encounter (Signed)
Done. Placed for Dr. Sullivan Lone to sign.

## 2021-02-19 ENCOUNTER — Other Ambulatory Visit: Payer: Self-pay

## 2021-02-19 DIAGNOSIS — J301 Allergic rhinitis due to pollen: Secondary | ICD-10-CM

## 2021-02-19 MED ORDER — LORATADINE 10 MG PO TABS: 10.0000 mg | ORAL_TABLET | Freq: Every day | ORAL | 3 refills | Status: DC

## 2021-02-19 NOTE — Telephone Encounter (Signed)
Patient called for refill. Sent into the pharmacy.

## 2021-07-22 ENCOUNTER — Encounter: Payer: Self-pay | Admitting: Family Medicine

## 2021-07-22 ENCOUNTER — Other Ambulatory Visit: Payer: Self-pay

## 2021-07-22 ENCOUNTER — Ambulatory Visit: Payer: Managed Care, Other (non HMO) | Admitting: Family Medicine

## 2021-07-22 MED ORDER — PHENTERMINE HCL 30 MG PO CAPS: 30.0000 mg | ORAL_CAPSULE | ORAL | 4 refills | Status: DC

## 2021-07-22 NOTE — Progress Notes (Signed)
I,Marvin James,acting as a scribe for Marvin Mans, MD.,have documented all relevant documentation on the behalf of Marvin Mans, MD,as directed by  Marvin Mans, MD while in the presence of Marvin Mans, MD.  Established patient visit   Patient: Marvin James   DOB: 1981/01/04   40 y.o. Male  MRN: [id number] Visit Date: 07/22/2021  Today's healthcare provider: [name], MD   Chief Complaint  Patient presents with   Follow-up   Weight Loss   Subjective    HPI  Patient feels well.  He has been working on diet and exercise. States the phentermine has helped him in the past when he uses it. Follow up for Obesity  The patient was last seen for this 6 months ago. Changes made at last visit include starting phentermine 30mg  daily.  He reports good compliance with treatment. He feels that condition is Improved. He is not having side effects. none  -----------------------------------------------------------------------------------------  Patient states he is doing well on phentermine.   Medications: Outpatient Medications Prior to Visit  Medication Sig   loratadine (CLARITIN) 10 MG tablet Take 1 tablet (10 mg total) by mouth daily.   phentermine 30 MG capsule Take 1 capsule (30 mg total) by mouth every morning.   amoxicillin-clavulanate (AUGMENTIN) 875-125 MG tablet Take 1 tablet by mouth 2 (two) times daily. (Patient not taking: No sig reported)   [DISCONTINUED] azithromycin (ZITHROMAX) 250 MG tablet Take 2 tablets by mouth today, then 1 tablet by mouth daily thereafter.   [DISCONTINUED] naproxen (EC NAPROSYN) 500 MG EC tablet Take 1 tablet (500 mg total) by mouth 2 (two) times daily with a meal. (Patient not taking: Reported on 07/22/2021)   No facility-administered medications prior to visit.    Review of Systems     Objective    BP 127/85 (BP Location: Right Arm, Patient Position: Sitting, Cuff Size: Large)   Pulse 77   Temp  98.2 F (36.8 C) (Temporal)   Resp 16   Ht 5\' 8"  (1.727 m)   Wt 288 lb (130.6 kg)   SpO2 94%   BMI 43.79 kg/m    Physical Exam Vitals reviewed.  Constitutional:      General: He is not in acute distress.    Appearance: He is well-developed.  HENT:     Head: Normocephalic and atraumatic.     Right Ear: Hearing normal.     Left Ear: Hearing normal.     Nose: Nose normal.  Eyes:     General: Lids are normal. No scleral icterus.       Right eye: No discharge.        Left eye: No discharge.     Conjunctiva/sclera: Conjunctivae normal.  Cardiovascular:     Rate and Rhythm: Normal rate and regular rhythm.     Heart sounds: Normal heart sounds.  Pulmonary:     Effort: Pulmonary effort is normal. No respiratory distress.  Skin:    Findings: No lesion or rash.  Neurological:     General: No focal deficit present.     Mental Status: He is alert and oriented to person, place, and time.  Psychiatric:        Mood and Affect: Mood normal.        Speech: Speech normal.        Behavior: Behavior normal.        Thought Content: Thought content normal.        Judgment: Judgment  normal.       No results found for any visits on 07/22/21.  Assessment & Plan     1. Morbid obesity (HCC) Refill phentermine and follow-up next year for complete physical. - phentermine 30 MG capsule; Take 1 capsule (30 mg total) by mouth every morning.  Dispense: 30 capsule; Refill: 4   No follow-ups on file.      I, Marvin Mans, MD, have reviewed all documentation for this visit. The documentation on 08/02/21 for the exam, diagnosis, procedures, and orders are all accurate and complete.    Santiago Stenzel Wendelyn Breslow, MD  [facility] [phone] (phone) [phone] (fax)  [facility]

## 2022-01-21 ENCOUNTER — Encounter: Payer: Self-pay | Admitting: Family Medicine

## 2022-01-21 ENCOUNTER — Ambulatory Visit (INDEPENDENT_AMBULATORY_CARE_PROVIDER_SITE_OTHER): Payer: Managed Care, Other (non HMO) | Admitting: Family Medicine

## 2022-01-21 VITALS — BP 124/88 | HR 72 | Resp 16 | Ht 68.0 in | Wt 299.9 lb

## 2022-01-21 DIAGNOSIS — Z1389 Encounter for screening for other disorder: Secondary | ICD-10-CM | POA: Diagnosis not present

## 2022-01-21 DIAGNOSIS — Z0000 Encounter for general adult medical examination without abnormal findings: Secondary | ICD-10-CM

## 2022-01-21 DIAGNOSIS — Z23 Encounter for immunization: Secondary | ICD-10-CM | POA: Diagnosis not present

## 2022-01-21 DIAGNOSIS — J309 Allergic rhinitis, unspecified: Secondary | ICD-10-CM | POA: Diagnosis not present

## 2022-01-21 DIAGNOSIS — L570 Actinic keratosis: Secondary | ICD-10-CM

## 2022-01-21 LAB — POCT URINALYSIS DIPSTICK
Bilirubin, UA: NEGATIVE
Blood, UA: NEGATIVE
Glucose, UA: NEGATIVE
Ketones, UA: NEGATIVE
Leukocytes, UA: NEGATIVE
Nitrite, UA: NEGATIVE
Protein, UA: NEGATIVE
Spec Grav, UA: <=1.005 — AB (ref 1.010–1.025)
Urobilinogen, UA: 0.2 E.U./dL
pH, UA: 7.5 (ref 5.0–8.0)

## 2022-01-21 MED ORDER — FLUTICASONE PROPIONATE 50 MCG/ACT NA SUSP
2.0000 | Freq: Every day | NASAL | 3 refills | Status: AC
Start: 2022-01-21 — End: ?

## 2022-01-21 NOTE — Progress Notes (Signed)
? ? ? ?Complete physical exam ? ? ?Patient: Marvin James   DOB: [birth year]   41 y.o. Male  MRN: [id number] ?Visit Date: 01/21/2022 ? ?Today's healthcare provider: Wilhemena Durie, MD  ? ?Chief Complaint  ?Patient presents with  ? Annual Exam  ? ?Subjective  ?  ? ?HPI  ?Marvin James is a 41 y.o. male who presents today for a complete physical exam.  ?He reports consuming a general diet. The patient does not participate in regular exercise at present. He generally feels fairly well, patient complains of allergy like symptoms with itchy eyes. He reports sleeping well. He does not have additional problems to discuss today.  ?He has some mild allergy symptoms with congestion. ?Family history is expanded with a cousin with colon cancer in his 53s. ? ?Past Medical History:  ?Diagnosis Date  ? Allergy   ? ?No past surgical history on file. ?Social History  ? ?Socioeconomic History  ? Marital status: Married  ?  Spouse name: Not on file  ? Number of children: Not on file  ? Years of education: Not on file  ? Highest education level: Not on file  ?Occupational History  ? Not on file  ?Tobacco Use  ? Smoking status: Never  ? Smokeless tobacco: Never  ?Vaping Use  ? Vaping Use: Never used  ?Substance and Sexual Activity  ? Alcohol use: Yes  ? Drug use: Never  ? Sexual activity: Yes  ?Other Topics Concern  ? Not on file  ?Social History Narrative  ? Not on file  ? ?Social Determinants of Health  ? ?Financial Resource Strain: Not on file  ?Food Insecurity: Not on file  ?Transportation Needs: Not on file  ?Physical Activity: Not on file  ?Stress: Not on file  ?Social Connections: Not on file  ?Intimate Partner Violence: Not on file  ? ?Family Status  ?Relation Name Status  ? Mother  Alive  ? Father  Deceased  ? Sister  Alive  ? ?Family History  ?Problem Relation Age of Onset  ? Arthritis Mother   ? Diabetes Father   ? Stroke Father   ? Hypertension Father   ? ?No Known Allergies  ?Patient Care Team: ?Jerrol Banana., MD  as PCP - General (Family Medicine)  ? ?Medications: ?Outpatient Medications Prior to Visit  ?Medication Sig  ? loratadine (CLARITIN) 10 MG tablet Take 1 tablet (10 mg total) by mouth daily.  ? phentermine 30 MG capsule Take 1 capsule (30 mg total) by mouth every morning.  ? amoxicillin-clavulanate (AUGMENTIN) 875-125 MG tablet Take 1 tablet by mouth 2 (two) times daily. (Patient not taking: Reported on 01/20/2021)  ? ?No facility-administered medications prior to visit.  ? ? ?Review of Systems  ?HENT:  Positive for sinus pressure and sneezing.   ?Eyes:  Positive for itching.  ?Allergic/Immunologic: Positive for environmental allergies.  ?All other systems reviewed and are negative. ? ?Last metabolic panel ?Lab Results  ?Component Value Date  ? GLUCOSE 101 (H) 01/20/2021  ? NA 137 01/20/2021  ? K 4.5 01/20/2021  ? CL 98 01/20/2021  ? CO2 21 01/20/2021  ? BUN 11 01/20/2021  ? CREATININE 0.97 01/20/2021  ? EGFR 101 01/20/2021  ? CALCIUM 9.5 01/20/2021  ? PROT 7.7 01/20/2021  ? ALBUMIN 4.4 01/20/2021  ? LABGLOB 3.3 01/20/2021  ? AGRATIO 1.3 01/20/2021  ? BILITOT 0.7 01/20/2021  ? ALKPHOS 57 01/20/2021  ? AST 26 01/20/2021  ? ALT 32 01/20/2021  ? ?  ?  Objective  ?  ?BP 124/88   Pulse 72   Resp 16   Ht $R'5\' 8"'dB$  (1.727 m)   Wt 299 lb 14.4 oz (136 kg)   SpO2 99%   BMI 45.60 kg/m?  ?BP Readings from Last 3 Encounters:  ?01/21/22 124/88  ?07/22/21 127/85  ?01/20/21 131/86  ? ?Wt Readings from Last 3 Encounters:  ?01/21/22 299 lb 14.4 oz (136 kg)  ?07/22/21 288 lb (130.6 kg)  ?01/20/21 (!) 307 lb (139.3 kg)  ? ?  ? ? ?Physical Exam ?Vitals reviewed.  ?Constitutional:   ?   Appearance: He is well-developed.  ?HENT:  ?   Head: Normocephalic and atraumatic.  ?   Right Ear: External ear normal.  ?   Left Ear: External ear normal.  ?   Nose: Nose normal.  ?Eyes:  ?   Conjunctiva/sclera: Conjunctivae normal.  ?   Pupils: Pupils are equal, round, and reactive to light.  ?Cardiovascular:  ?   Rate and Rhythm: Normal rate and regular  rhythm.  ?   Heart sounds: Normal heart sounds.  ?Pulmonary:  ?   Effort: Pulmonary effort is normal.  ?   Breath sounds: Normal breath sounds.  ?Abdominal:  ?   Palpations: Abdomen is soft.  ?Genitourinary: ?   Penis: Normal.   ?   Testes: Normal.  ?Musculoskeletal:  ?   Cervical back: Normal range of motion and neck supple.  ?   Right lower leg: No edema.  ?   Left lower leg: No edema.  ?Skin: ?   General: Skin is warm and dry.  ?   Comments: Some solar keratosis of the face noted.  ?Neurological:  ?   General: No focal deficit present.  ?   Mental Status: He is alert and oriented to person, place, and time.  ?Psychiatric:     ?   Mood and Affect: Mood normal.     ?   Behavior: Behavior normal.     ?   Thought Content: Thought content normal.     ?   Judgment: Judgment normal.  ?  ? ? ?Last depression screening scores ? ?  01/21/2022  ?  9:10 AM 01/20/2021  ?  9:25 AM 12/12/2018  ?  4:01 PM  ?PHQ 2/9 Scores  ?PHQ - 2 Score 0 0 0  ?PHQ- 9 Score 1    ? ?Last fall risk screening ? ?  01/20/2021  ?  9:24 AM  ?Fall Risk   ?Falls in the past year? 0  ?Number falls in past yr: 0  ?Injury with Fall? 0  ?Risk for fall due to : No Fall Risks  ?Follow up Falls evaluation completed  ? ?Last Audit-C alcohol use screening ? ?  05/04/2018  ?  2:11 PM  ?Alcohol Use Disorder Test (AUDIT)  ?1. How often do you have a drink containing alcohol? 2  ?2. How many drinks containing alcohol do you have on a typical day when you are drinking? 2  ?3. How often do you have six or more drinks on one occasion? 1  ?AUDIT-C Score 5  ? ?A score of 3 or more in women, and 4 or more in men indicates increased risk for alcohol abuse, EXCEPT if all of the points are from question 1  ? ?Results for orders placed or performed in visit on 01/21/22  ?POCT urinalysis dipstick  ?Result Value Ref Range  ? Color, UA yellow   ? Clarity, UA clear   ?  Glucose, UA Negative Negative  ? Bilirubin, UA negative   ? Ketones, UA negative   ? Spec Grav, UA <=1.005 (A) 1.010  - 1.025  ? Blood, UA negative   ? pH, UA 7.5 5.0 - 8.0  ? Protein, UA Negative Negative  ? Urobilinogen, UA 0.2 0.2 or 1.0 E.U./dL  ? Nitrite, UA negative   ? Leukocytes, UA Negative Negative  ? Appearance    ? Odor    ? ? Assessment & Plan  ?  ?Routine Health Maintenance and Physical Exam ? ?Exercise Activities and Dietary recommendations ? Goals   ?None ?  ? ? ?Immunization History  ?Administered Date(s) Administered  ? Tdap 01/21/2022  ? ? ?Health Maintenance  ?Topic Date Due  ? COVID-19 Vaccine (1) Never done  ? HIV Screening  Never done  ? INFLUENZA VACCINE  05/12/2022  ? TETANUS/TDAP  01/22/2032  ? Hepatitis C Screening  Completed  ? HPV VACCINES  Aged Out  ? ? ?Discussed health benefits of physical activity, and encouraged him to engage in regular exercise appropriate for his age and condition. ? ?1. Annual physical exam ?Colonoscopy age 52 ?- Lipid panel ?- TSH ?- CBC w/Diff/Platelet ?- Comprehensive Metabolic Panel (CMET) ? ?2. Morbid obesity ([place]) ?Diet and exercise stress with a goal weight loss of more than 10% body weight in the next year ?- Lipid panel ?- TSH ?- CBC w/Diff/Platelet ?- Comprehensive Metabolic Panel (CMET) ? ?3. Screening for blood or protein in urine ? ?- POCT urinalysis dipstick ? ?4. Allergic rhinitis, unspecified seasonality, unspecified trigger ? ?- fluticasone (FLONASE) 50 MCG/ACT nasal spray; Place 2 sprays into both nostrils daily.  Dispense: 16 g; Refill: 3 ? ?5. AK (actinic keratosis) ? ?- Ambulatory referral to Dermatology ? ?6. Need for tetanus booster ? ?- Tdap vaccine greater than or equal to 7yo IM ? ? ?Return in about 1 year (around 01/22/2023).  ?  ? ?I, Wilhemena Durie, MD, have reviewed all documentation for this visit. The documentation on 01/25/22 for the exam, diagnosis, procedures, and orders are all accurate and complete. ? ? ?[employer] as a Education administrator for [employer], MD.,have documented all relevant documentation on the behalf of Wilhemena Durie, MD,as directed by  Wilhemena Durie, MD while in the presence of Wilhemena Durie, MD. ? ? ?Wardell Pokorski Cranford Mon, MD  ?[facility] ?[phone] (phone) ?[phone] (fax) ? ?C

## 2022-01-21 NOTE — Patient Instructions (Signed)

## 2022-01-22 LAB — COMPREHENSIVE METABOLIC PANEL
ALT: 36 IU/L (ref 0–44)
AST: 26 IU/L (ref 0–40)
Albumin/Globulin Ratio: 1.4 (ref 1.2–2.2)
Albumin: 4.5 g/dL (ref 4.0–5.0)
Alkaline Phosphatase: 66 IU/L (ref 44–121)
BUN/Creatinine Ratio: 10 (ref 9–20)
BUN: 10 mg/dL (ref 6–24)
Bilirubin Total: 1.5 mg/dL — ABNORMAL HIGH (ref 0.0–1.2)
CO2: 26 mmol/L (ref 20–29)
Calcium: 9.5 mg/dL (ref 8.7–10.2)
Chloride: 99 mmol/L (ref 96–106)
Creatinine, Ser: 1.01 mg/dL (ref 0.76–1.27)
Globulin, Total: 3.3 g/dL (ref 1.5–4.5)
Glucose: 103 mg/dL — ABNORMAL HIGH (ref 70–99)
Potassium: 4.4 mmol/L (ref 3.5–5.2)
Sodium: 139 mmol/L (ref 134–144)
Total Protein: 7.8 g/dL (ref 6.0–8.5)
eGFR: 96 mL/min/{1.73_m2} (ref >59)

## 2022-01-22 LAB — CBC WITH DIFFERENTIAL/PLATELET
Basophils Absolute: 0.1 10*3/uL (ref 0.0–0.2)
Basos: 1 %
EOS (ABSOLUTE): 0.2 10*3/uL (ref 0.0–0.4)
Eos: 2 %
Hematocrit: 47.5 % (ref 37.5–51.0)
Hemoglobin: 16.5 g/dL (ref 13.0–17.7)
Immature Grans (Abs): 0 10*3/uL (ref 0.0–0.1)
Immature Granulocytes: 0 %
Lymphocytes Absolute: 2 10*3/uL (ref 0.7–3.1)
Lymphs: 31 %
MCH: 32.4 pg (ref 26.6–33.0)
MCHC: 34.7 g/dL (ref 31.5–35.7)
MCV: 93 fL (ref 79–97)
Monocytes Absolute: 0.6 10*3/uL (ref 0.1–0.9)
Monocytes: 9 %
Neutrophils Absolute: 3.6 10*3/uL (ref 1.4–7.0)
Neutrophils: 57 %
Platelets: 247 10*3/uL (ref 150–450)
RBC: 5.09 x10E6/uL (ref 4.14–5.80)
RDW: 11.9 % (ref 11.6–15.4)
WBC: 6.4 10*3/uL (ref 3.4–10.8)

## 2022-01-22 LAB — LIPID PANEL
Chol/HDL Ratio: 3.7 ratio (ref 0.0–5.0)
Cholesterol, Total: 172 mg/dL (ref 100–199)
HDL: 47 mg/dL (ref >39)
LDL Chol Calc (NIH): 110 mg/dL — ABNORMAL HIGH (ref 0–99)
Triglycerides: 78 mg/dL (ref 0–149)
VLDL Cholesterol Cal: 15 mg/dL (ref 5–40)

## 2022-01-22 LAB — TSH: TSH: 1.26 u[IU]/mL (ref 0.450–4.500)

## 2022-03-02 ENCOUNTER — Other Ambulatory Visit: Payer: Self-pay | Admitting: Family Medicine

## 2022-03-02 DIAGNOSIS — J301 Allergic rhinitis due to pollen: Secondary | ICD-10-CM

## 2022-03-04 NOTE — Telephone Encounter (Signed)
Requested Prescriptions  Pending Prescriptions Disp Refills  . loratadine (CLARITIN) 10 MG tablet [Pharmacy Med Name: LORATADINE 10MG  TABLETS] 90 tablet 2    Sig: TAKE 1 TABLET BY MOUTH EVERY DAY     Ear, Nose, and Throat:  Antihistamines 2 Passed - 03/02/2022  6:01 PM      Passed - Cr in normal range and within 360 days    Creatinine, Ser  Date Value Ref Range Status  01/21/2022 1.01 0.76 - 1.27 mg/dL Final         Passed - Valid encounter within last 12 months    Recent Outpatient Visits          1 month ago Annual physical exam   [facility] MD   7 months ago Morbid obesity [facility])   [facility] MD   1 year ago Annual physical exam   [facility] MD   3 years ago Chronic sinusitis, unspecified location   [facility] MD   3 years ago Acute otitis media, unspecified otitis media type   [facility] MD      Future Appointments            In 1 month Maple Hudson., MD [facility]   In 4 months [facility], MD [facility]   In 10 months Deirdre Evener., MD [facility]

## 2022-04-30 ENCOUNTER — Ambulatory Visit (INDEPENDENT_AMBULATORY_CARE_PROVIDER_SITE_OTHER): Payer: Managed Care, Other (non HMO) | Admitting: Family Medicine

## 2022-04-30 ENCOUNTER — Encounter: Payer: Self-pay | Admitting: Family Medicine

## 2022-04-30 DIAGNOSIS — R7309 Other abnormal glucose: Secondary | ICD-10-CM | POA: Diagnosis not present

## 2022-04-30 DIAGNOSIS — R7303 Prediabetes: Secondary | ICD-10-CM | POA: Diagnosis not present

## 2022-04-30 MED ORDER — TIRZEPATIDE 2.5 MG/0.5ML ~~LOC~~ SOAJ: 2.5000 mg | SUBCUTANEOUS | 0 refills | Status: DC

## 2022-04-30 NOTE — Progress Notes (Signed)
Established patient visit  I,Joseline E Rosas,acting as a scribe for Wilhemena Durie, MD.,have documented all relevant documentation on the behalf of Wilhemena Durie, MD,as directed by  Wilhemena Durie, MD while in the presence of Wilhemena Durie, MD.   Patient: Marvin James   DOB: [birth year]   41 y.o. Male  MRN: [id number] Visit Date: 04/30/2022  Today's healthcare provider: Wilhemena Durie, MD   Chief Complaint  Patient presents with   Follow-up   Subjective    HPI  Patient here to have labs repeat. Patient glucose level was elevated in the last blood results. Patient is interested in prediabetic medications to help him with weight loss. Phentermine was not helpful and he could not sleep when he took it. Follow up for weight management  The patient was last seen for this 3-4 months ago. Changes made at last visit include continue phentermine. He would like to try something else for his weight.  He reports fair compliance with treatment. He feels that condition is Unchanged. He is having side effects. Insomnia.   Wt Readings from Last 3 Encounters:  04/30/22 (!) 304 lb 1.6 oz (137.9 kg)  01/21/22 299 lb 14.4 oz (136 kg)  07/22/21 288 lb (130.6 kg)    -----------------------------------------------------------------------------------------   Medications: Outpatient Medications Prior to Visit  Medication Sig   fluticasone (FLONASE) 50 MCG/ACT nasal spray Place 2 sprays into both nostrils daily.   loratadine (CLARITIN) 10 MG tablet TAKE 1 TABLET BY MOUTH EVERY DAY   phentermine 30 MG capsule Take 1 capsule (30 mg total) by mouth every morning.   amoxicillin-clavulanate (AUGMENTIN) 875-125 MG tablet Take 1 tablet by mouth 2 (two) times daily. (Patient not taking: Reported on 01/20/2021)   No facility-administered medications prior to visit.    Review of Systems  Last metabolic panel Lab Results  Component Value Date   GLUCOSE 103 (H) 01/21/2022    NA 139 01/21/2022   K 4.4 01/21/2022   CL 99 01/21/2022   CO2 26 01/21/2022   BUN 10 01/21/2022   CREATININE 1.01 01/21/2022   EGFR 96 01/21/2022   CALCIUM 9.5 01/21/2022   PROT 7.7 05/01/2022   ALBUMIN 4.4 05/01/2022   LABGLOB 3.3 01/21/2022   AGRATIO 1.4 01/21/2022   BILITOT 0.9 05/01/2022   ALKPHOS 56 05/01/2022   AST 25 05/01/2022   ALT 35 05/01/2022       Objective    BP 127/79 (BP Location: Left Arm, Patient Position: Sitting, Cuff Size: Large)   Pulse 77   Temp 98.2 F (36.8 C) (Oral)   Resp 16   Ht $R'5\' 8"'cr$  (1.727 m)   Wt (!) 304 lb 1.6 oz (137.9 kg)   SpO2 97%   BMI 46.24 kg/m  BP Readings from Last 3 Encounters:  04/30/22 127/79  01/21/22 124/88  07/22/21 127/85   Wt Readings from Last 3 Encounters:  04/30/22 (!) 304 lb 1.6 oz (137.9 kg)  01/21/22 299 lb 14.4 oz (136 kg)  07/22/21 288 lb (130.6 kg)      Physical Exam Vitals reviewed.  Constitutional:      General: He is not in acute distress.    Appearance: He is well-developed.  HENT:     Head: Normocephalic and atraumatic.     Right Ear: Hearing normal.     Left Ear: Hearing normal.     Nose: Nose normal.  Eyes:     General: Lids are normal. No scleral icterus.  Right eye: No discharge.        Left eye: No discharge.     Conjunctiva/sclera: Conjunctivae normal.  Cardiovascular:     Rate and Rhythm: Normal rate and regular rhythm.     Heart sounds: Normal heart sounds.  Pulmonary:     Effort: Pulmonary effort is normal. No respiratory distress.  Skin:    Findings: No lesion or rash.  Neurological:     General: No focal deficit present.     Mental Status: He is alert and oriented to person, place, and time.  Psychiatric:        Mood and Affect: Mood normal.        Speech: Speech normal.        Behavior: Behavior normal.        Thought Content: Thought content normal.        Judgment: Judgment normal.       No results found for any visits on 04/30/22.  Assessment & Plan      1. Morbid obesity ([place]) With diet and exercise stressed. - Hemoglobin A1c - Hepatic function panel - tirzepatide (MOUNJARO) 2.5 MG/0.5ML Pen; Inject 2.5 mg into the skin once a week.  Dispense: 2 mL; Refill: 0  2. Elevated glucose level  - Hemoglobin A1c - tirzepatide (MOUNJARO) 2.5 MG/0.5ML Pen; Inject 2.5 mg into the skin once a week.  Dispense: 2 mL; Refill: 0  3. Prediabetes Try Mounjaro and follow-up in 2 months on the lowest dose. - Hemoglobin A1c   No follow-ups on file.      I, Wilhemena Durie, MD, have reviewed all documentation for this visit. The documentation on 05/06/22 for the exam, diagnosis, procedures, and orders are all accurate and complete.    Schyler Counsell Cranford Mon, MD  [facility] [phone] (phone) [phone] (fax)  [place]

## 2022-05-02 LAB — HEPATIC FUNCTION PANEL
ALT: 35 IU/L (ref 0–44)
AST: 25 IU/L (ref 0–40)
Albumin: 4.4 g/dL (ref 4.1–5.1)
Alkaline Phosphatase: 56 IU/L (ref 44–121)
Bilirubin Total: 0.9 mg/dL (ref 0.0–1.2)
Bilirubin, Direct: 0.21 mg/dL (ref 0.00–0.40)
Total Protein: 7.7 g/dL (ref 6.0–8.5)

## 2022-05-02 LAB — HEMOGLOBIN A1C
Est. average glucose Bld gHb Est-mCnc: 108 mg/dL
Hgb A1c MFr Bld: 5.4 % (ref 4.8–5.6)

## 2022-05-07 ENCOUNTER — Encounter: Payer: Self-pay | Admitting: Family Medicine

## 2022-07-02 ENCOUNTER — Ambulatory Visit: Payer: Managed Care, Other (non HMO) | Admitting: Family Medicine

## 2022-07-22 ENCOUNTER — Ambulatory Visit: Payer: Managed Care, Other (non HMO) | Admitting: Dermatology

## 2022-07-22 DIAGNOSIS — L814 Other melanin hyperpigmentation: Secondary | ICD-10-CM | POA: Diagnosis not present

## 2022-07-22 DIAGNOSIS — L821 Other seborrheic keratosis: Secondary | ICD-10-CM

## 2022-07-22 DIAGNOSIS — L570 Actinic keratosis: Secondary | ICD-10-CM

## 2022-07-22 DIAGNOSIS — L820 Inflamed seborrheic keratosis: Secondary | ICD-10-CM | POA: Diagnosis not present

## 2022-07-22 DIAGNOSIS — L578 Other skin changes due to chronic exposure to nonionizing radiation: Secondary | ICD-10-CM | POA: Diagnosis not present

## 2022-07-22 NOTE — Progress Notes (Deleted)
   Follow-Up Visit   Subjective  Marvin James is a 41 y.o. male who presents for the following: Actinic Keratosis (Referral from Dr Rosanna Randy - AK of face - would like sun exposed areas checked).  The patient has spots, moles and lesions to be evaluated, some may be new or changing and the patient has concerns that these could be cancer.   The following portions of the chart were reviewed this encounter and updated as appropriate:       Review of Systems:  No other skin or systemic complaints except as noted in HPI or Assessment and Plan.  Objective  Well appearing patient in no apparent distress; mood and affect are within normal limits.  A focused examination was performed including scalp, face,arms, hands. Relevant physical exam findings are noted in the Assessment and Plan.  Scalp (5) Erythematous thin papules/macules with gritty scale.   Face, neck Stuck-on, waxy, tan-brown papules and plaques -- Discussed benign etiology and prognosis.   Scalp Erythematous stuck-on, waxy papule or plaque    Assessment & Plan   Actinic Damage - chronic, secondary to cumulative UV radiation exposure/sun exposure over time - diffuse scaly erythematous macules with underlying dyspigmentation - Recommend daily broad spectrum sunscreen SPF 30+ to sun-exposed areas, reapply every 2 hours as needed.  - Recommend staying in the shade or wearing long sleeves, sun glasses (UVA+UVB protection) and wide brim hats (4-inch brim around the entire circumference of the hat). - Call for new or changing lesions.  Lentigines - Scattered tan macules - Due to sun exposure - Benign-appearing, observe - Recommend daily broad spectrum sunscreen SPF 30+ to sun-exposed areas, reapply every 2 hours as needed. - Call for any changes  AK (actinic keratosis) (5) Scalp  Destruction of lesion - Scalp Complexity: simple   Destruction method: cryotherapy   Informed consent: discussed and consent obtained    Timeout:  patient name, date of birth, surgical site, and procedure verified Lesion destroyed using liquid nitrogen: Yes   Region frozen until ice ball extended beyond lesion: Yes   Outcome: patient tolerated procedure well with no complications   Post-procedure details: wound care instructions given    Seborrheic keratosis Face, neck  Benign-appearing.  Observation.  Call clinic for new or changing lesions.  Recommend daily use of broad spectrum spf 30+ sunscreen to sun-exposed areas.    Inflamed seborrheic keratosis Scalp  Destruction of lesion - Scalp Complexity: simple   Destruction method: cryotherapy   Informed consent: discussed and consent obtained   Timeout:  patient name, date of birth, surgical site, and procedure verified Lesion destroyed using liquid nitrogen: Yes   Region frozen until ice ball extended beyond lesion: Yes   Outcome: patient tolerated procedure well with no complications   Post-procedure details: wound care instructions given     Return in about 1 year (around 07/23/2023).  I, Ashok Cordia, CMA, am acting as scribe for Sarina Ser, MD .

## 2022-07-22 NOTE — Progress Notes (Signed)
   New Patient Visit  Subjective  Marvin James is a 41 y.o. male who presents for the following: Actinic Keratosis (Referral from Dr Rosanna Randy - AK of face - would like sun exposed areas checked). The patient has spots, moles and lesions to be evaluated, some may be new or changing and the patient has concerns that these could be cancer.  The following portions of the chart were reviewed this encounter and updated as appropriate:   Tobacco  Allergies  Meds  Problems  Med Hx  Surg Hx  Fam Hx     Review of Systems:  No other skin or systemic complaints except as noted in HPI or Assessment and Plan.  Objective  Well appearing patient in no apparent distress; mood and affect are within normal limits.  A focused examination was performed including scalp, face, arms, hands. Relevant physical exam findings are noted in the Assessment and Plan.  Scalp (5) Erythematous thin papules/macules with gritty scale.   Face, neck Stuck-on, waxy, tan-brown papules and plaques -- Discussed benign etiology and prognosis.   Scalp Erythematous stuck-on, waxy papule or plaque   Assessment & Plan   Actinic Damage - chronic, secondary to cumulative UV radiation exposure/sun exposure over time - diffuse scaly erythematous macules with underlying dyspigmentation - Recommend daily broad spectrum sunscreen SPF 30+ to sun-exposed areas, reapply every 2 hours as needed.  - Recommend staying in the shade or wearing long sleeves, sun glasses (UVA+UVB protection) and wide brim hats (4-inch brim around the entire circumference of the hat). - Call for new or changing lesions.  Lentigines - Scattered tan macules - Due to sun exposure - Benign-appearing, observe - Recommend daily broad spectrum sunscreen SPF 30+ to sun-exposed areas, reapply every 2 hours as needed. - Call for any changes  AK (actinic keratosis) (5) Scalp Destruction of lesion - Scalp Complexity: simple   Destruction method:  cryotherapy   Informed consent: discussed and consent obtained   Timeout:  patient name, date of birth, surgical site, and procedure verified Lesion destroyed using liquid nitrogen: Yes   Region frozen until ice ball extended beyond lesion: Yes   Outcome: patient tolerated procedure well with no complications   Post-procedure details: wound care instructions given    Seborrheic keratosis Face, neck Benign-appearing.  Observation.  Call clinic for new or changing lesions.  Recommend daily use of broad spectrum spf 30+ sunscreen to sun-exposed areas.   Inflamed seborrheic keratosis Scalp Symptomatic, irritating, patient would like treated. Destruction of lesion - Scalp Complexity: simple   Destruction method: cryotherapy   Informed consent: discussed and consent obtained   Timeout:  patient name, date of birth, surgical site, and procedure verified Lesion destroyed using liquid nitrogen: Yes   Region frozen until ice ball extended beyond lesion: Yes   Outcome: patient tolerated procedure well with no complications   Post-procedure details: wound care instructions given    Return in about 1 year (around 07/23/2023).  I, Ashok Cordia, CMA, am acting as scribe for Sarina Ser, MD . Documentation: I have reviewed the above documentation for accuracy and completeness, and I agree with the above.  Sarina Ser, MD

## 2022-07-22 NOTE — Patient Instructions (Signed)
Cryotherapy Aftercare  Wash gently with soap and water everyday.   Apply Vaseline and Band-Aid daily until healed.     Due to recent changes in healthcare laws, you may see results of your pathology and/or laboratory studies on MyChart before the doctors have had a chance to review them. We understand that in some cases there may be results that are confusing or concerning to you. Please understand that not all results are received at the same time and often the doctors may need to interpret multiple results in order to provide you with the best plan of care or course of treatment. Therefore, we ask that you please give us 2 business days to thoroughly review all your results before contacting the office for clarification. Should we see a critical lab result, you will be contacted sooner.   If You Need Anything After Your Visit  If you have any questions or concerns for your doctor, please call our main line at [phone] and press option 4 to reach your doctor's medical assistant. If no one answers, please leave a voicemail as directed and we will return your call as soon as possible. Messages left after 4 pm will be answered the following business day.   You may also send us a message via MyChart. We typically respond to MyChart messages within 1-2 business days.  For prescription refills, please ask your pharmacy to contact our office. Our fax number is [phone].  If you have an urgent issue when the clinic is closed that cannot wait until the next business day, you can page your doctor at the number below.    Please note that while we do our best to be available for urgent issues outside of office hours, we are not available 24/7.   If you have an urgent issue and are unable to reach us, you may choose to seek medical care at your doctor's office, retail clinic, urgent care center, or emergency room.  If you have a medical emergency, please immediately call 911 or go to the  emergency department.  Pager Numbers  - Dr. Kowalski: [phone]  - Dr. Moye: [phone]  - Dr. Stewart: [phone]  In the event of inclement weather, please call our main line at [phone] for an update on the status of any delays or closures.  Dermatology Medication Tips: Please keep the boxes that topical medications come in in order to help keep track of the instructions about where and how to use these. Pharmacies typically print the medication instructions only on the boxes and not directly on the medication tubes.   If your medication is too expensive, please contact our office at [phone] option 4 or send us a message through MyChart.   We are unable to tell what your co-pay for medications will be in advance as this is different depending on your insurance coverage. However, we may be able to find a substitute medication at lower cost or fill out paperwork to get insurance to cover a needed medication.   If a prior authorization is required to get your medication covered by your insurance company, please allow us 1-2 business days to complete this process.  Drug prices often vary depending on where the prescription is filled and some pharmacies may offer cheaper prices.  The website www.goodrx.com contains coupons for medications through different pharmacies. The prices here do not account for what the cost may be with help from insurance (it may be cheaper with your insurance), but the website can   give you the price if you did not use any insurance.  - You can print the associated coupon and take it with your prescription to the pharmacy.  - You may also stop by our office during regular business hours and pick up a GoodRx coupon card.  - If you need your prescription sent electronically to a different pharmacy, notify our office through [employer] MyChart or by phone at [phone] option 4.     Si Usted Necesita Algo Despus de Su Visita  Tambin puede  enviarnos un mensaje a travs de MyChart. Por lo general respondemos a los mensajes de MyChart en el transcurso de 1 a 2 das hbiles.  Para renovar recetas, por favor pida a su farmacia que se ponga en contacto con nuestra oficina. Nuestro nmero de fax es el [phone].  Si tiene un asunto urgente cuando la clnica est cerrada y que no puede esperar hasta el siguiente da hbil, puede llamar/localizar a su doctor(a) al nmero que aparece a continuacin.   Por favor, tenga en cuenta que aunque hacemos todo lo posible para estar disponibles para asuntos urgentes fuera del horario de oficina, no estamos disponibles las 24 horas del da, los 7 das de la semana.   Si tiene un problema urgente y no puede comunicarse con nosotros, puede optar por buscar atencin mdica  en el consultorio de su doctor(a), en una clnica privada, en un centro de atencin urgente o en una sala de emergencias.  Si tiene una emergencia mdica, por favor llame inmediatamente al 911 o vaya a la sala de emergencias.  Nmeros de bper  - Dr. Kowalski: [phone]  - Dra. Moye: [phone]  - Dra. Stewart: [phone]  En caso de inclemencias del tiempo, por favor llame a nuestra lnea principal al [phone] para una actualizacin sobre el estado de cualquier retraso o cierre.  Consejos para la medicacin en dermatologa: Por favor, guarde las cajas en las que vienen los medicamentos de uso tpico para ayudarle a seguir las instrucciones sobre dnde y cmo usarlos. Las farmacias generalmente imprimen las instrucciones del medicamento slo en las cajas y no directamente en los tubos del medicamento.   Si su medicamento es muy caro, por favor, pngase en contacto con nuestra oficina llamando al [phone] y presione la opcin 4 o envenos un mensaje a travs de MyChart.   No podemos decirle cul ser su copago por los medicamentos por adelantado ya que esto es diferente dependiendo de la cobertura de su seguro.  Sin embargo, es posible que podamos encontrar un medicamento sustituto a menor costo o llenar un formulario para que el seguro cubra el medicamento que se considera necesario.   Si se requiere una autorizacin previa para que su compaa de seguros cubra su medicamento, por favor permtanos de 1 a 2 das hbiles para completar este proceso.  Los precios de los medicamentos varan con frecuencia dependiendo del lugar de dnde se surte la receta y alguna farmacias pueden ofrecer precios ms baratos.  El sitio web www.goodrx.com tiene cupones para medicamentos de diferentes farmacias. Los precios aqu no tienen en cuenta lo que podra costar con la ayuda del seguro (puede ser ms barato con su seguro), pero el sitio web puede darle el precio si no utiliz ningn seguro.  - Puede imprimir el cupn correspondiente y llevarlo con su receta a la farmacia.  - Tambin puede pasar por nuestra oficina durante el horario de atencin regular y recoger una tarjeta de cupones de GoodRx.  -   Si necesita que su receta se enve electrnicamente a una farmacia diferente, informe a nuestra oficina a travs de MyChart de [place] o por telfono llamando al [phone] y presione la opcin 4.  

## 2022-08-01 ENCOUNTER — Encounter: Payer: Self-pay | Admitting: Dermatology

## 2023-01-26 ENCOUNTER — Encounter: Payer: Managed Care, Other (non HMO) | Admitting: Family Medicine

## 2023-07-28 ENCOUNTER — Ambulatory Visit: Payer: Managed Care, Other (non HMO) | Admitting: Dermatology

## 2024-01-07 ENCOUNTER — Encounter: Payer: Self-pay | Admitting: Urology

## 2024-01-07 ENCOUNTER — Ambulatory Visit: Payer: Managed Care, Other (non HMO) | Admitting: Urology

## 2024-01-07 VITALS — BP 112/75 | HR 65 | Ht 68.0 in | Wt 311.0 lb

## 2024-01-07 DIAGNOSIS — Z3009 Encounter for other general counseling and advice on contraception: Secondary | ICD-10-CM | POA: Diagnosis not present

## 2024-01-07 NOTE — Progress Notes (Signed)
 01/07/2024 8:46 AM   Marvin James 31-Jan-1981 [id number]  Referring provider: Bosie Clos, MD [address] [place],  [place] [address]  Chief Complaint  Patient presents with   VAS Consult    HPI: Marvin James is a 43 y.o. male who presents for vasectomy counseling.  Married with 2 children Denies prior history urologic problems including chronic scrotal pain, epididymitis or orchitis No previous history inguinal hernia or pelvic surgery No history of bleeding or clotting disorders   PMH: Past Medical History:  Diagnosis Date   Allergy     Surgical History: History reviewed. No pertinent surgical history.  Home Medications:  Allergies as of 01/07/2024   No Known Allergies      Medication List        Accurate as of January 07, 2024  8:46 AM. If you have any questions, ask your nurse or doctor.          STOP taking these medications    amoxicillin-clavulanate 875-125 MG tablet Commonly known as: AUGMENTIN Stopped by: Riki Altes   phentermine 30 MG capsule Stopped by: Riki Altes   tirzepatide 2.5 MG/0.5ML Pen Commonly known as: MOUNJARO Stopped by: Riki Altes       TAKE these medications    fluticasone 50 MCG/ACT nasal spray Commonly known as: FLONASE Place 2 sprays into both nostrils daily.   loratadine 10 MG tablet Commonly known as: CLARITIN TAKE 1 TABLET BY MOUTH EVERY DAY        Allergies: No Known Allergies  Family History: Family History  Problem Relation Age of Onset   Arthritis Mother    Diabetes Father    Stroke Father    Hypertension Father     Social History:  reports that he has never smoked. He has never used smokeless tobacco. He reports current alcohol use. He reports that he does not use drugs.   Physical Exam: BP 112/75   Pulse 65   Ht 5\' 8"  (1.727 m)   Wt (!) 311 lb (141.1 kg)   BMI 47.29 kg/m   Constitutional:  Alert and oriented, No acute distress. HEENT: [place] AT Respiratory:  Normal respiratory effort, no increased work of breathing. GU: Phallus without lesions, testes descended bilaterally without masses or tenderness, thick spermatic cords however vasa palpable bilaterally Psychiatric: Normal mood and affect.   Assessment & Plan:    1.  Undesired fertility Desires to schedule vasectomy We had a long discussion about vasectomy. We specifically discussed the procedure, recovery and the risks, benefits and alternatives of vasectomy. I explained that the procedure entails removal of a segment of each vas deferens, each of which conducts sperm, and that the purpose of this procedure is to cause sterility (inability to produce children or cause pregnancy). Vasectomy is intended to be permanent and irreversible form of contraception. Options for fertility after vasectomy include vasectomy reversal, or sperm retrieval with in vitro fertilization. These options are not always successful, and they may be expensive. We discussed reversible forms of birth control such as condoms, IUD or diaphragms, as well as the option of freezing sperm in a sperm bank prior to the vasectomy procedure. We discussed the importance of avoiding strenuous exercise for four days after vasectomy, and the importance of refraining from any form of ejaculation for seven days after vasectomy. I explained that vasectomy does not produce immediate sterility so another form of contraceptive must be used until sterility is assured by having semen checked for sperm. Thus,  a post vasectomy semen analysis is necessary to confirm sterility. Rarely, vasectomy must be repeated. We discussed the approximately 1 in 2,000 risk of pregnancy after vasectomy for men who have post-vasectomy semen analysis showing absent sperm or rare non-motile sperm. Typical side effects include a small amount of oozing blood, some discomfort and mild swelling in the area of incision.  Vasectomy does not affect sexual performance, function,  please, sensation, interest, desire, satisfaction, penile erection, volume of semen or ejaculation. Other rare risks include allergy or adverse reaction to an anesthetic, testicular atrophy, hematoma, infection/abscess, prolonged tenderness of the vas deferens, pain, swelling, painful nodule or scar (called sperm granuloma) or epididymtis. We discussed chronic testicular pain syndrome. This has been reported to occur in as many as 1-2% of men and may be permanent. This can be treated with medication, small procedures or (rarely) surgery. He indicated he would call back if he desires a preprocedure anxiolytic and would need a driver if utilizing   Riki Altes, MD  [facility] [address], [address] [place], [place] [address] [phone]

## 2024-01-07 NOTE — Patient Instructions (Signed)
 Pre-Vasectomy Instructions ? ?STOP all aspirin or blood thinners (Aspirin, Plavix, Coumadin, Warfarin, Motrin, Ibuprofen, Advil, Aleve, Naproxen, Naprosyn) for 7 days prior to the procedure.  If you have any questions about stopping these medications please contact your primary care physician or cardiologist. ? ?Shave all hair from the upper scrotum on the day of the procedure.  This means just under the penis onto the scrotal sac.  The area shaved should measure about 2-3 inches around.  You may lather the scrotum with soap and water, and shave with a safety razor. ? ?After shaving the area, thoroughly wash the penis and the scrotum, then shower or bathe to remove all the loose hairs.  If needed, wash the area again just before coming in for your Vasectomy. ? ?It is recommended to have a light meal an hour or so prior to the procedure. ? ?Bring a scrotal support (jock strap or suspensory, or tight jockey shorts or underwear).  Wear comfortable pants or shorts. ? ?While the actual procedure usually takes about 45 minutes, you should be prepared to stay in the office for approximately one hour.  Bring someone with you to drive you home. ? ?If you have any questions or concerns, please feel free to call the office at [phone]. ?  ?

## 2024-03-10 ENCOUNTER — Ambulatory Visit (INDEPENDENT_AMBULATORY_CARE_PROVIDER_SITE_OTHER): Admitting: Urology

## 2024-03-10 ENCOUNTER — Encounter: Payer: Self-pay | Admitting: Urology

## 2024-03-10 VITALS — BP 123/85 | HR 76 | Ht 68.0 in | Wt 300.0 lb

## 2024-03-10 DIAGNOSIS — Z302 Encounter for sterilization: Secondary | ICD-10-CM

## 2024-03-10 NOTE — Patient Instructions (Signed)

## 2024-03-10 NOTE — Progress Notes (Signed)
   Vasectomy Procedure Note  Indications: Marvin James is a 43 y.o. male who presents today for elective sterilization.  He has been consented for the procedure.  He is aware of the risks and benefits.  He had no additional questions.  He agrees to proceed.  He denies any other significant change since his last visit.  Pre-operative Diagnosis: Elective sterilization  Post-operative Diagnosis: Elective sterilization  Premedication: N/A  Surgeon: Geralyn Knee C. Nekia Maxham, M.D  Description: The patient was prepped and draped in the standard fashion.  The right vas deferens was identified and brought superiorly to the anterior scrotal skin.  The skin and vas were then anesthetized utilizing 10 ml 1% lidocaine.  A small stab incision was made and spread with the vas dissector.  The vas was grasped utilizing the vas clamp and elevated out of the incision.   The vas was grasped utilizing the vas clamp and the vas sheath was incised.  The vas was dissected out of the sheath, elevated out of the incision and dissected free from the surrounding tissue and vessels.  A 1 cm segment was excised.  The vas lumens were cauterized with needle tip electrocautery for a distance of at least 1 cm.  No significant bleeding was observed.  The vas ends were then dropped back into the hemiscrotum.  The skin was closed with a 3-0 chromic horizontal mattress suture.  An identical procedure was performed on the contralateral side.  Clean dry gauze was applied to the incision sites.  The patient tolerated the procedure well.  Complications:None  Recommendations: 1.  No lifting greater than 10 pounds or strenuous activity for 1 week. 2.  Scrotal support for 1-2 weeks. 3.  May shower in 24 hours; no bath, hot tub for 1 week 4.  No intercourse for at least 7 days and resume based on level of discomfort  5.  Continue alternate contraception for 12 weeks.  6.  Call for significant pain, swelling, redness, drainage or fever greater  than 100.5. 7.  Rx hydrocodone/APAP 5/325 1-2 every 6 hours prn pain. 8.  Follow-up semen analysis in 12 weeks.   Darlynn Elam, MD

## 2024-06-09 ENCOUNTER — Other Ambulatory Visit

## 2024-06-14 ENCOUNTER — Other Ambulatory Visit

## 2024-06-14 ENCOUNTER — Other Ambulatory Visit: Payer: Self-pay

## 2024-06-14 DIAGNOSIS — Z302 Encounter for sterilization: Secondary | ICD-10-CM

## 2024-06-15 LAB — POST-VAS SPERM EVALUATION,QUAL: Volume: 4.9 mL

## 2024-06-16 ENCOUNTER — Ambulatory Visit: Payer: Self-pay | Admitting: Urology
